# Patient Record
Sex: Female | Born: 1970 | Race: White | Hispanic: No | Marital: Single | Smoking: Never smoker
Health system: Southern US, Community
[De-identification: ages and names within clinical notes are randomized; demographics above are authoritative.]

## PROBLEM LIST (undated history)

## (undated) DIAGNOSIS — F419 Anxiety disorder, unspecified: Secondary | ICD-10-CM

## (undated) DIAGNOSIS — N201 Calculus of ureter: Secondary | ICD-10-CM

## (undated) DIAGNOSIS — J45990 Exercise induced bronchospasm: Secondary | ICD-10-CM

## (undated) DIAGNOSIS — K219 Gastro-esophageal reflux disease without esophagitis: Secondary | ICD-10-CM

## (undated) DIAGNOSIS — Z973 Presence of spectacles and contact lenses: Secondary | ICD-10-CM

## (undated) DIAGNOSIS — Z9109 Other allergy status, other than to drugs and biological substances: Secondary | ICD-10-CM

## (undated) DIAGNOSIS — R0602 Shortness of breath: Secondary | ICD-10-CM

## (undated) DIAGNOSIS — R319 Hematuria, unspecified: Secondary | ICD-10-CM

## (undated) DIAGNOSIS — Z8639 Personal history of other endocrine, nutritional and metabolic disease: Secondary | ICD-10-CM

## (undated) DIAGNOSIS — J189 Pneumonia, unspecified organism: Secondary | ICD-10-CM

## (undated) DIAGNOSIS — R3915 Urgency of urination: Secondary | ICD-10-CM

## (undated) DIAGNOSIS — N200 Calculus of kidney: Secondary | ICD-10-CM

## (undated) DIAGNOSIS — R51 Headache: Secondary | ICD-10-CM

## (undated) HISTORY — DX: Anxiety disorder, unspecified: F41.9

---
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
## 1998-06-25 HISTORY — PX: CERVICAL BIOPSY  W/ LOOP ELECTRODE EXCISION: SUR135

## 1998-11-02 ENCOUNTER — Other Ambulatory Visit: Admission: RE | Admit: 1998-11-02 | Discharge: 1998-11-02 | Payer: Self-pay | Admitting: Obstetrics and Gynecology

## 2001-02-28 ENCOUNTER — Other Ambulatory Visit: Admission: RE | Admit: 2001-02-28 | Discharge: 2001-02-28 | Payer: Self-pay | Admitting: Obstetrics and Gynecology

## 2001-04-11 ENCOUNTER — Other Ambulatory Visit: Admission: RE | Admit: 2001-04-11 | Discharge: 2001-04-11 | Payer: Self-pay | Admitting: Obstetrics and Gynecology

## 2001-09-02 ENCOUNTER — Other Ambulatory Visit: Admission: RE | Admit: 2001-09-02 | Discharge: 2001-09-02 | Payer: Self-pay | Admitting: Obstetrics and Gynecology

## 2002-03-03 ENCOUNTER — Other Ambulatory Visit: Admission: RE | Admit: 2002-03-03 | Discharge: 2002-03-03 | Payer: Self-pay | Admitting: Obstetrics and Gynecology

## 2002-12-01 ENCOUNTER — Other Ambulatory Visit: Admission: RE | Admit: 2002-12-01 | Discharge: 2002-12-01 | Payer: Self-pay | Admitting: Obstetrics and Gynecology

## 2003-03-26 ENCOUNTER — Other Ambulatory Visit: Admission: RE | Admit: 2003-03-26 | Discharge: 2003-03-26 | Payer: Self-pay | Admitting: Obstetrics and Gynecology

## 2003-09-07 ENCOUNTER — Ambulatory Visit (HOSPITAL_COMMUNITY): Admission: RE | Admit: 2003-09-07 | Discharge: 2003-09-07 | Payer: Self-pay | Admitting: Plastic Surgery

## 2003-09-14 HISTORY — PX: BREAST REDUCTION SURGERY: SHX8

## 2003-11-03 ENCOUNTER — Other Ambulatory Visit: Admission: RE | Admit: 2003-11-03 | Discharge: 2003-11-03 | Payer: Self-pay | Admitting: Obstetrics and Gynecology

## 2004-01-08 ENCOUNTER — Emergency Department (HOSPITAL_COMMUNITY): Admission: EM | Admit: 2004-01-08 | Discharge: 2004-01-08 | Payer: Self-pay | Admitting: Emergency Medicine

## 2004-06-16 ENCOUNTER — Other Ambulatory Visit: Admission: RE | Admit: 2004-06-16 | Discharge: 2004-06-16 | Payer: Self-pay | Admitting: Obstetrics and Gynecology

## 2005-03-01 ENCOUNTER — Other Ambulatory Visit: Admission: RE | Admit: 2005-03-01 | Discharge: 2005-03-01 | Payer: Self-pay | Admitting: Obstetrics and Gynecology

## 2005-09-11 ENCOUNTER — Other Ambulatory Visit: Admission: RE | Admit: 2005-09-11 | Discharge: 2005-09-11 | Payer: Self-pay | Admitting: Obstetrics & Gynecology

## 2006-10-01 ENCOUNTER — Other Ambulatory Visit: Admission: RE | Admit: 2006-10-01 | Discharge: 2006-10-01 | Payer: Self-pay | Admitting: Obstetrics & Gynecology

## 2008-06-01 ENCOUNTER — Other Ambulatory Visit: Admission: RE | Admit: 2008-06-01 | Discharge: 2008-06-01 | Payer: Self-pay | Admitting: Obstetrics and Gynecology

## 2008-09-16 ENCOUNTER — Emergency Department (HOSPITAL_COMMUNITY): Admission: EM | Admit: 2008-09-16 | Discharge: 2008-09-16 | Payer: Self-pay | Admitting: Emergency Medicine

## 2010-10-05 LAB — CBC
HCT: 41.4 % (ref 36.0–46.0)
Hemoglobin: 14.5 g/dL (ref 12.0–15.0)
MCV: 92.1 fL (ref 78.0–100.0)
RBC: 4.49 MIL/uL (ref 3.87–5.11)
WBC: 11.9 10*3/uL — ABNORMAL HIGH (ref 4.0–10.5)

## 2010-10-05 LAB — DIFFERENTIAL
Basophils Relative: 0 % (ref 0–1)
Lymphocytes Relative: 13 % (ref 12–46)
Lymphs Abs: 1.6 10*3/uL (ref 0.7–4.0)

## 2010-10-05 LAB — URINALYSIS, ROUTINE W REFLEX MICROSCOPIC
Bilirubin Urine: NEGATIVE
Ketones, ur: NEGATIVE mg/dL
Protein, ur: NEGATIVE mg/dL
Urobilinogen, UA: 0.2 mg/dL (ref 0.0–1.0)

## 2010-10-05 LAB — BASIC METABOLIC PANEL
Calcium: 9.5 mg/dL (ref 8.4–10.5)
GFR calc Af Amer: >60 mL/min (ref >60)
GFR calc non Af Amer: >60 mL/min (ref >60)
Sodium: 136 mEq/L (ref 135–145)

## 2010-10-05 LAB — PREGNANCY, URINE: Preg Test, Ur: NEGATIVE

## 2013-01-05 ENCOUNTER — Ambulatory Visit (INDEPENDENT_AMBULATORY_CARE_PROVIDER_SITE_OTHER): Payer: BC Managed Care – PPO | Admitting: Gynecology

## 2013-01-05 ENCOUNTER — Encounter: Payer: Self-pay | Admitting: Gynecology

## 2013-01-05 VITALS — BP 130/76 | HR 62 | Resp 18 | Ht 64.5 in | Wt 193.0 lb

## 2013-01-05 DIAGNOSIS — G43109 Migraine with aura, not intractable, without status migrainosus: Secondary | ICD-10-CM | POA: Insufficient documentation

## 2013-01-05 DIAGNOSIS — Z9889 Other specified postprocedural states: Secondary | ICD-10-CM

## 2013-01-05 DIAGNOSIS — Z87898 Personal history of other specified conditions: Secondary | ICD-10-CM

## 2013-01-05 DIAGNOSIS — Z01419 Encounter for gynecological examination (general) (routine) without abnormal findings: Secondary | ICD-10-CM

## 2013-01-05 DIAGNOSIS — Z8742 Personal history of other diseases of the female genital tract: Secondary | ICD-10-CM

## 2013-01-05 DIAGNOSIS — Z0000 Encounter for general adult medical examination without abnormal findings: Secondary | ICD-10-CM

## 2013-01-05 LAB — POCT URINALYSIS DIPSTICK
Leukocytes, UA: NEGATIVE
pH, UA: 5

## 2013-01-05 LAB — HEMOGLOBIN, FINGERSTICK: Hemoglobin, fingerstick: 14.5 g/dL (ref 12.0–16.0)

## 2013-01-05 NOTE — Assessment & Plan Note (Signed)
Annual pap

## 2013-01-05 NOTE — Patient Instructions (Signed)

## 2013-01-05 NOTE — Progress Notes (Signed)
42 y.o. Single Caucasian female   [id number] here for annual exam. Pt is  Not currently sexually active.  Happy with condom.  Menses are regular.  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  Denies unexplained bleeding, no pelvic pain.  Pt reports migraines 1-2x/yr with aura  Patient's last menstrual period was 12/12/2012.          Sexually active: not currently The current method of family planning is condoms.    Exercising: yes  jogging, walking, golfing 3x/wk Last pap: 2010/2011 Alcohol: Less than 1  Tobacco: no BSE: yes  Hgb: 14.5 ; Urine: Normal    No health maintenance topics applied.  No family history on file.  There are no active problems to display for this patient.   Past Medical History  Diagnosis Date  . Anxiety     Past Surgical History  Procedure Laterality Date  . Cervical biopsy  w/ loop electrode excision  2000    Normal  . Breast reduction  2006  . Tonsillectomy  1983    Allergies: Codeine  Current Outpatient Prescriptions  Medication Sig Dispense Refill  . Aspirin-Acetaminophen-Caffeine (MIGRAINE RELIEF PO) Take by mouth.      . calcium carbonate (TUMS - DOSED IN MG ELEMENTAL CALCIUM) 500 MG chewable tablet Chew 1 tablet by mouth daily.      Marland Kitchen CRANBERRY EXTRACT PO Take by mouth.      . MELATONIN PO Take by mouth.      . Multiple Vitamins-Minerals (MULTIVITAMIN PO) Take by mouth.      . Omega-3 Fatty Acids (FISH OIL PO) Take by mouth.       No current facility-administered medications for this visit.    ROS: Pertinent items are noted in HPI.  Exam:    BP 130/76  Pulse 62  Resp 18  Ht 5' 4.5" (1.638 m)  Wt 193 lb (87.544 kg)  BMI 32.63 kg/m2  LMP 12/12/2012 Weight change: @WEIGHTCHANGE @ Last 3 height recordings:  Ht Readings from Last 3 Encounters:  01/05/13 5' 4.5" (1.638 m)   General appearance: alert, cooperative and appears stated  age Head: Normocephalic, without obvious abnormality, atraumatic Neck: no adenopathy, no carotid bruit, no JVD, supple, symmetrical, trachea midline and thyroid not enlarged, symmetric, no tenderness/mass/nodules Lungs: clear to auscultation bilaterally Breasts: normal appearance, no masses or tenderness, s/p reduction Heart: regular rate and rhythm, S1, S2 normal, no murmur, click, rub or gallop Abdomen: soft, non-tender; bowel sounds normal; no masses,  no organomegaly Extremities: extremities normal, atraumatic, no cyanosis or edema Skin: Skin color, texture, turgor normal. No rashes or lesions Lymph nodes: Cervical, supraclavicular, and axillary nodes normal. no inguinal nodes palpated Neurologic: Grossly normal   Pelvic: External genitalia:  no lesions              Urethra: normal appearing urethra with no masses, tenderness or lesions              Bartholins and Skenes: normal                 Vagina: normal appearing vagina with normal color and discharge, no lesions              Cervix: normal appearance and LEEP scar              Pap taken: yes        Bimanual  Exam:  Uterus:  uterus is normal size, shape, consistency and nontender                                      Adnexa:    normal adnexa in size, nontender and no masses                                      Rectovaginal: Confirms                                      Anus:  normal sphincter tone, no lesions  A: well woman Contraceptive management     P: mammogram starting now pap smear qy for 20 Migraines with aura- contraindication to combination ocp return annually or prn   An After Visit Summary was printed and given to the patient.

## 2013-01-06 ENCOUNTER — Encounter: Payer: Self-pay | Admitting: Nurse Practitioner

## 2013-01-13 ENCOUNTER — Telehealth: Payer: Self-pay | Admitting: *Deleted

## 2013-01-13 NOTE — Telephone Encounter (Signed)
Patient notified.  See labs  

## 2013-01-13 NOTE — Telephone Encounter (Signed)
Message copied by Lorraine Lax on Tue Jan 13, 2013 10:28 AM ------      Message from: Douglass Rivers      Created: Fri Jan 09, 2013 12:51 PM       Inform neg, recall 8, h/o LEEP CIN II ------

## 2013-01-13 NOTE — Telephone Encounter (Signed)
Left Message To Call Back  

## 2013-10-27 ENCOUNTER — Encounter (HOSPITAL_COMMUNITY): Payer: Self-pay | Admitting: Emergency Medicine

## 2013-10-27 ENCOUNTER — Emergency Department (HOSPITAL_COMMUNITY)
Admission: EM | Admit: 2013-10-27 | Discharge: 2013-10-27 | Disposition: A | Discharge status: Home / Self Care | Payer: BC Managed Care – PPO | Attending: Emergency Medicine | Admitting: Emergency Medicine

## 2013-10-27 DIAGNOSIS — R109 Unspecified abdominal pain: Secondary | ICD-10-CM | POA: Insufficient documentation

## 2013-10-27 DIAGNOSIS — Z3202 Encounter for pregnancy test, result negative: Secondary | ICD-10-CM | POA: Insufficient documentation

## 2013-10-27 DIAGNOSIS — Z79899 Other long term (current) drug therapy: Secondary | ICD-10-CM | POA: Insufficient documentation

## 2013-10-27 DIAGNOSIS — N3090 Cystitis, unspecified without hematuria: Secondary | ICD-10-CM | POA: Insufficient documentation

## 2013-10-27 DIAGNOSIS — IMO0001 Reserved for inherently not codable concepts without codable children: Secondary | ICD-10-CM

## 2013-10-27 DIAGNOSIS — R55 Syncope and collapse: Secondary | ICD-10-CM | POA: Insufficient documentation

## 2013-10-27 DIAGNOSIS — R110 Nausea: Secondary | ICD-10-CM | POA: Insufficient documentation

## 2013-10-27 DIAGNOSIS — Z791 Long term (current) use of non-steroidal anti-inflammatories (NSAID): Secondary | ICD-10-CM | POA: Insufficient documentation

## 2013-10-27 DIAGNOSIS — R259 Unspecified abnormal involuntary movements: Secondary | ICD-10-CM | POA: Insufficient documentation

## 2013-10-27 DIAGNOSIS — N3091 Cystitis, unspecified with hematuria: Secondary | ICD-10-CM

## 2013-10-27 DIAGNOSIS — Z8659 Personal history of other mental and behavioral disorders: Secondary | ICD-10-CM | POA: Insufficient documentation

## 2013-10-27 DIAGNOSIS — Z87440 Personal history of urinary (tract) infections: Secondary | ICD-10-CM | POA: Insufficient documentation

## 2013-10-27 DIAGNOSIS — R569 Unspecified convulsions: Secondary | ICD-10-CM | POA: Insufficient documentation

## 2013-10-27 LAB — CBC WITH DIFFERENTIAL/PLATELET
BASOS PCT: 0 % (ref 0–1)
Basophils Absolute: 0 10*3/uL (ref 0.0–0.1)
EOS ABS: 0.6 10*3/uL (ref 0.0–0.7)
EOS PCT: 6 % — AB (ref 0–5)
HCT: 42.2 % (ref 36.0–46.0)
Hemoglobin: 14.9 g/dL (ref 12.0–15.0)
Lymphocytes Relative: 17 % (ref 12–46)
Lymphs Abs: 1.6 10*3/uL (ref 0.7–4.0)
MCH: 32 pg (ref 26.0–34.0)
MCHC: 35.3 g/dL (ref 30.0–36.0)
MCV: 90.8 fL (ref 78.0–100.0)
Monocytes Absolute: 0.4 10*3/uL (ref 0.1–1.0)
Monocytes Relative: 5 % (ref 3–12)
NEUTROS ABS: 6.7 10*3/uL (ref 1.7–7.7)
NEUTROS PCT: 72 % (ref 43–77)
PLATELETS: 279 10*3/uL (ref 150–400)
RBC: 4.65 MIL/uL (ref 3.87–5.11)
RDW: 12.9 % (ref 11.5–15.5)
WBC: 9.3 10*3/uL (ref 4.0–10.5)

## 2013-10-27 LAB — WET PREP, GENITAL
Clue Cells Wet Prep HPF POC: NONE SEEN
Trich, Wet Prep: NONE SEEN
YEAST WET PREP: NONE SEEN

## 2013-10-27 LAB — BASIC METABOLIC PANEL
BUN: 18 mg/dL (ref 6–23)
CO2: 27 meq/L (ref 19–32)
Calcium: 9.6 mg/dL (ref 8.4–10.5)
Chloride: 97 mEq/L (ref 96–112)
Creatinine, Ser: 0.7 mg/dL (ref 0.50–1.10)
GLUCOSE: 102 mg/dL — AB (ref 70–99)
POTASSIUM: 4.2 meq/L (ref 3.7–5.3)
SODIUM: 136 meq/L — AB (ref 137–147)

## 2013-10-27 LAB — PREGNANCY, URINE: PREG TEST UR: NEGATIVE

## 2013-10-27 LAB — URINE MICROSCOPIC-ADD ON

## 2013-10-27 LAB — URINALYSIS, ROUTINE W REFLEX MICROSCOPIC
Glucose, UA: NEGATIVE mg/dL
Ketones, ur: NEGATIVE mg/dL
LEUKOCYTES UA: NEGATIVE
Nitrite: POSITIVE — AB
PH: 5 (ref 5.0–8.0)
Protein, ur: 100 mg/dL — AB
UROBILINOGEN UA: 1 mg/dL (ref 0.0–1.0)

## 2013-10-27 MED ORDER — PHENAZOPYRIDINE HCL 200 MG PO TABS: 200.0000 mg | ORAL_TABLET | Freq: Three times a day (TID) | ORAL | Status: DC | PRN

## 2013-10-27 MED ORDER — SULFAMETHOXAZOLE-TRIMETHOPRIM 800-160 MG PO TABS
1.0000 | ORAL_TABLET | Freq: Two times a day (BID) | ORAL | Status: DC
Start: 2013-10-27 — End: 2013-12-17

## 2013-10-27 NOTE — ED Notes (Signed)
Pt was at work today and [employer] menstral like abdominal pain today per pt she took a Midol. Pt noted blood in her urine at work and felt faint and laid down in the floor and passed out. Coworkers called EMS. Pt states that she faints at the site of blood and it is not unusual for her.

## 2013-10-27 NOTE — Discharge Instructions (Signed)
Drink plenty of fluids. Take the antibiotic until gone. Take the pyridium for your bladder spasm pain. Recheck if you get a fever, vomiting, pain in your back or you feel worse.

## 2013-10-27 NOTE — ED Provider Notes (Signed)
CSN: [id number]     Arrival date & time 10/27/13  1019 History  This chart was scribed for Ward GivensIva L Jasmeet Gehl, MD by Joaquin MusicKristina Sanchez-Matthews, ED Scribe. This patient was seen in room APA07/APA07 and the patient's care was started at 11:31 AM.   Chief Complaint  Patient presents with  . Loss of Consciousness   No language interpreter was used.   HPI Comments: Kelli Harmon is a 43 y.o. female brought in by ambulance who presents to the Emergency Department complaining of sudden LOC that occurred this morning. Pt states she used the restroom this morning, noticed blood and dark colored urine. Reports having mild aching abd pain with associated nausea but states she took OTC Acetaminophen. States she was having mild crampy lower abdominal pain that began yesterday in the left. She states she generally has syncopal episodes with the sight of blood and other events for years and reports having seizures when she has syncope as well in the most recent years. About 9:30 this morning she started getting return of the lower crit the abdominal discomfort and when she went to the bathroom she saw blood in her urine again. She states she she started to leave her work to walk out to the parking lot to go get evaluated for the blood in her urine, however she started feeling clammy and shaky and felt like she was going to pass out. She went into the bathroom and the feeling persisted so she laid down on the floor. She had nausea without vomiting. She had a syncopal episode while on the floor in the restroom with some seizure activity and states she had a colleague in the restroom with her at the time. Colleague ran out to get help and unsure how long her seizure lasted. States she woke up feeling normal and reports being aware where she was. She did not have a post ictal state afterwards and had no incontinence. She has no injury from the seizure. Pt states she is aware of her body and states she "knows when she is going to  have a syncopal episode". Pt states her father has a hx of seizures post syncopal episodes also. Reports having a waxing and waning ache while urinating. LNMP was 2 weeks ago. Reports one prior UTI 2 years ago. Denies diarrhea, fever, lower back pain, injuries due to seizure at this time, spotting, possibility of pregnancy due to not having sex, taking medications regularly, smoking and drinking.  PCP was Dr. Georgina PillionMassey but is now [facility] [place].   Past Medical History  Diagnosis Date  . Anxiety    Past Surgical History  Procedure Laterality Date  . Cervical biopsy  w/ loop electrode excision  2000    Normal  . Breast reduction  09/14/03  . Tonsillectomy  1983   No family history on file. History  Substance Use Topics  . Smoking status: Never Smoker   . Smokeless tobacco: Not on file  . Alcohol Use: 2.0 oz/week    4 drink(s) per week   employed  OB History   Grav Para Term Preterm Abortions TAB SAB Ect Mult Living   0 0 0 0 0 0 0 0 0 0      Review of Systems  Constitutional: Negative for fever.  Cardiovascular: Positive for syncope.  Gastrointestinal: Positive for nausea and abdominal pain. Negative for vomiting and diarrhea.  Musculoskeletal: Negative for back pain and myalgias.  Neurological: Positive for syncope.  All other systems  reviewed and are negative.  Allergies  Codeine  Home Medications   Prior to Admission medications   Medication Sig Start Date End Date Taking? Authorizing Provider  GINSENG PO Take 1 capsule by mouth daily.   Yes Historical Provider, MD  Ibuprofen (MIDOL) 200 MG CAPS Take 1 capsule by mouth daily as needed (pain).   Yes Historical Provider, MD  MELATONIN PO Take 1 tablet by mouth at bedtime as needed (sleep).    Yes Historical Provider, MD  Multiple Vitamins-Minerals (MULTIVITAMIN PO) Take 1 tablet by mouth daily.    Yes Historical Provider, MD  naproxen sodium (ALEVE) 220 MG tablet Take 220 mg by mouth daily as needed  (headache).   Yes Historical Provider, MD   BP 153/98  Pulse 76  Temp(Src) 97.8 F (36.6 C) (Oral)  Resp 18  Ht 5\' 4"  (1.626 m)  Wt 180 lb (81.647 kg)  BMI 30.88 kg/m2  SpO2 100%  LMP 10/20/2013  Vital signs normal   Orthostatic vital signs showed no hypotension or tachycardia   Physical Exam  Nursing note and vitals reviewed. Constitutional: She is oriented to person, place, and time. She appears well-developed and well-nourished.  Non-toxic appearance. She does not appear ill. No distress.  HENT:  Head: Normocephalic and atraumatic.  Right Ear: External ear normal.  Left Ear: External ear normal.  Nose: Nose normal. No mucosal edema or rhinorrhea.  Mouth/Throat: Oropharynx is clear and moist and mucous membranes are normal. No dental abscesses or uvula swelling.  Eyes: Conjunctivae and EOM are normal. Pupils are equal, round, and reactive to light.  Neck: Normal range of motion and full passive range of motion without pain. Neck supple.  Cardiovascular: Normal rate, regular rhythm and normal heart sounds.  Exam reveals no gallop and no friction rub.   No murmur heard. Pulmonary/Chest: Effort normal and breath sounds normal. No respiratory distress. She has no wheezes. She has no rhonchi. She has no rales. She exhibits no tenderness and no crepitus.  Abdominal: Soft. Normal appearance and bowel sounds are normal. She exhibits no distension. There is no tenderness. There is no rebound and no guarding.  Genitourinary:  Normal external genitalia. No blood in the vault. There is some thick yellow white discharge. She has minor discomfort to palpation of the uterus and the left adnexa. There are no masses. Right adnexa is nontender.  Musculoskeletal: Normal range of motion. She exhibits no edema and no tenderness.  Moves all extremities well.   Neurological: She is alert and oriented to person, place, and time. She has normal strength. No cranial nerve deficit.  Skin: Skin is warm,  dry and intact. No rash noted. No erythema. No pallor.  Psychiatric: She has a normal mood and affect. Her speech is normal and behavior is normal. Her mood appears not anxious.    ED Course  Procedures   Medications - No data to display  DIAGNOSTIC STUDIES: Oxygen Saturation is 99% on RA, normal by my interpretation.    COORDINATION OF CARE: 11:38 AM-Discussed treatment plan which includes UA and labs. Pt agreed to plan.   Labs Review Results for orders placed during the hospital encounter of 10/27/13  WET PREP, GENITAL      Result Value Ref Range   Yeast Wet Prep HPF POC NONE SEEN  NONE SEEN   Trich, Wet Prep NONE SEEN  NONE SEEN   Clue Cells Wet Prep HPF POC NONE SEEN  NONE SEEN   WBC, Wet Prep HPF POC MANY (*)  NONE SEEN  BASIC METABOLIC PANEL      Result Value Ref Range   Sodium 136 (*) 137 - 147 mEq/L   Potassium 4.2  3.7 - 5.3 mEq/L   Chloride 97  96 - 112 mEq/L   CO2 27  19 - 32 mEq/L   Glucose, Bld 102 (*) 70 - 99 mg/dL   BUN 18  6 - 23 mg/dL   Creatinine, Ser 9.60  0.50 - 1.10 mg/dL   Calcium 9.6  8.4 - 45.4 mg/dL   GFR calc non Af Amer >90  >90 mL/min   GFR calc Af Amer >90  >90 mL/min  CBC WITH DIFFERENTIAL      Result Value Ref Range   WBC 9.3  4.0 - 10.5 K/uL   RBC 4.65  3.87 - 5.11 MIL/uL   Hemoglobin 14.9  12.0 - 15.0 g/dL   HCT 09.8  11.9 - 14.7 %   MCV 90.8  78.0 - 100.0 fL   MCH 32.0  26.0 - 34.0 pg   MCHC 35.3  30.0 - 36.0 g/dL   RDW 82.9  56.2 - 13.0 %   Platelets 279  150 - 400 K/uL   Neutrophils Relative % 72  43 - 77 %   Neutro Abs 6.7  1.7 - 7.7 K/uL   Lymphocytes Relative 17  12 - 46 %   Lymphs Abs 1.6  0.7 - 4.0 K/uL   Monocytes Relative 5  3 - 12 %   Monocytes Absolute 0.4  0.1 - 1.0 K/uL   Eosinophils Relative 6 (*) 0 - 5 %   Eosinophils Absolute 0.6  0.0 - 0.7 K/uL   Basophils Relative 0  0 - 1 %   Basophils Absolute 0.0  0.0 - 0.1 K/uL  URINALYSIS, ROUTINE W REFLEX MICROSCOPIC      Result Value Ref Range   Color, Urine BROWN (*)  YELLOW   APPearance HAZY (*) CLEAR   Specific Gravity, Urine >1.030 (*) 1.005 - 1.030   pH 5.0  5.0 - 8.0   Glucose, UA NEGATIVE  NEGATIVE mg/dL   Hgb urine dipstick LARGE (*) NEGATIVE   Bilirubin Urine SMALL (*) NEGATIVE   Ketones, ur NEGATIVE  NEGATIVE mg/dL   Protein, ur 865 (*) NEGATIVE mg/dL   Urobilinogen, UA 1.0  0.0 - 1.0 mg/dL   Nitrite POSITIVE (*) NEGATIVE   Leukocytes, UA NEGATIVE  NEGATIVE  PREGNANCY, URINE      Result Value Ref Range   Preg Test, Ur NEGATIVE  NEGATIVE  URINE MICROSCOPIC-ADD ON      Result Value Ref Range   RBC / HPF TOO NUMEROUS TO COUNT  <3 RBC/hpf   Bacteria, UA MANY (*) RARE   Laboratory interpretation all normal except probable UTI      Imaging Review No results found.   EKG Interpretation None        Date: 10/27/2013  Rate: 85  Rhythm: normal sinus rhythm  QRS Axis: right  Intervals: normal  ST/T Wave abnormalities: normal  Conduction Disutrbances:none  Narrative Interpretation: low voltage precordial leads  Old EKG Reviewed: none available    MDM   Final diagnoses:  Vasovagal episode  Episode of jerking  Hemorrhagic cystitis    Discharge Medication List as of 10/27/2013  2:50 PM    START taking these medications   Details  phenazopyridine (PYRIDIUM) 200 MG tablet Take 1 tablet (200 mg total) by mouth 3 (three) times daily as needed (pain on urination)., Starting 10/27/2013,  Until Discontinued, Print    sulfamethoxazole-trimethoprim (SEPTRA DS) 800-160 MG per tablet Take 1 tablet by mouth every 12 (twelve) hours., Starting 10/27/2013, Until Discontinued, Print        Plan discharge   Devoria Albe, MD, FACEP   I personally performed the services described in this documentation, which was scribed in my presence. The recorded information has been reviewed and considered.  Devoria Albe, MD, Armando Gang    Ward Givens, MD 10/27/13 [phone]

## 2013-10-27 NOTE — ED Notes (Signed)
PT d/c to home with mother with NAD

## 2013-10-28 LAB — URINE CULTURE
COLONY COUNT: NO GROWTH
Culture: NO GROWTH

## 2013-10-28 LAB — GC/CHLAMYDIA PROBE AMP
CT Probe RNA: NEGATIVE
GC Probe RNA: NEGATIVE

## 2013-12-16 ENCOUNTER — Ambulatory Visit
Admission: RE | Admit: 2013-12-16 | Discharge: 2013-12-16 | Disposition: A | Discharge status: Home / Self Care | Payer: BC Managed Care – PPO | Source: Ambulatory Visit | Attending: Nephrology | Admitting: Nephrology

## 2013-12-16 ENCOUNTER — Other Ambulatory Visit: Payer: Self-pay | Admitting: Nephrology

## 2013-12-16 DIAGNOSIS — R319 Hematuria, unspecified: Secondary | ICD-10-CM

## 2013-12-16 DIAGNOSIS — R109 Unspecified abdominal pain: Secondary | ICD-10-CM

## 2013-12-17 ENCOUNTER — Other Ambulatory Visit: Payer: Self-pay | Admitting: Family Medicine

## 2013-12-17 ENCOUNTER — Other Ambulatory Visit: Payer: Self-pay | Admitting: Urology

## 2013-12-17 ENCOUNTER — Encounter (HOSPITAL_BASED_OUTPATIENT_CLINIC_OR_DEPARTMENT_OTHER): Payer: Self-pay | Admitting: *Deleted

## 2013-12-17 DIAGNOSIS — R109 Unspecified abdominal pain: Secondary | ICD-10-CM

## 2013-12-17 DIAGNOSIS — R319 Hematuria, unspecified: Secondary | ICD-10-CM

## 2013-12-17 NOTE — Progress Notes (Signed)
NPO AFTER MN. ARRIVE AT 0830. NEEDS HG AND URINE PREG. WILL TAKE PRILOSEC AM DOS W/ SIPS OF WATER AND IF NEEDED TAKE PAIN RX.

## 2013-12-18 ENCOUNTER — Encounter (HOSPITAL_BASED_OUTPATIENT_CLINIC_OR_DEPARTMENT_OTHER): Payer: Self-pay | Admitting: Anesthesiology

## 2013-12-18 ENCOUNTER — Encounter (HOSPITAL_BASED_OUTPATIENT_CLINIC_OR_DEPARTMENT_OTHER)
Admission: RE | Disposition: A | Discharge status: Home / Self Care | Payer: Self-pay | Source: Ambulatory Visit | Attending: Urology

## 2013-12-18 ENCOUNTER — Ambulatory Visit (HOSPITAL_BASED_OUTPATIENT_CLINIC_OR_DEPARTMENT_OTHER)
Admission: RE | Admit: 2013-12-18 | Discharge: 2013-12-18 | Disposition: A | Discharge status: Home / Self Care | Payer: BC Managed Care – PPO | Source: Ambulatory Visit | Attending: Urology | Admitting: Urology

## 2013-12-18 ENCOUNTER — Ambulatory Visit (HOSPITAL_BASED_OUTPATIENT_CLINIC_OR_DEPARTMENT_OTHER): Payer: BC Managed Care – PPO | Admitting: Anesthesiology

## 2013-12-18 ENCOUNTER — Encounter (HOSPITAL_BASED_OUTPATIENT_CLINIC_OR_DEPARTMENT_OTHER): Payer: BC Managed Care – PPO | Admitting: Anesthesiology

## 2013-12-18 DIAGNOSIS — Z885 Allergy status to narcotic agent status: Secondary | ICD-10-CM | POA: Insufficient documentation

## 2013-12-18 DIAGNOSIS — F411 Generalized anxiety disorder: Secondary | ICD-10-CM | POA: Insufficient documentation

## 2013-12-18 DIAGNOSIS — J45909 Unspecified asthma, uncomplicated: Secondary | ICD-10-CM | POA: Insufficient documentation

## 2013-12-18 DIAGNOSIS — E039 Hypothyroidism, unspecified: Secondary | ICD-10-CM | POA: Insufficient documentation

## 2013-12-18 DIAGNOSIS — Z79899 Other long term (current) drug therapy: Secondary | ICD-10-CM | POA: Insufficient documentation

## 2013-12-18 DIAGNOSIS — N201 Calculus of ureter: Secondary | ICD-10-CM | POA: Insufficient documentation

## 2013-12-18 DIAGNOSIS — K219 Gastro-esophageal reflux disease without esophagitis: Secondary | ICD-10-CM | POA: Insufficient documentation

## 2013-12-18 HISTORY — PX: CYSTOSCOPY WITH RETROGRADE PYELOGRAM, URETEROSCOPY AND STENT PLACEMENT: SHX5789

## 2013-12-18 HISTORY — DX: Calculus of ureter: N20.1

## 2013-12-18 HISTORY — DX: Personal history of other endocrine, nutritional and metabolic disease: Z86.39

## 2013-12-18 HISTORY — DX: Exercise induced bronchospasm: J45.990

## 2013-12-18 HISTORY — DX: Hematuria, unspecified: R31.9

## 2013-12-18 HISTORY — DX: Urgency of urination: R39.15

## 2013-12-18 HISTORY — PX: HOLMIUM LASER APPLICATION: SHX5852

## 2013-12-18 HISTORY — DX: Presence of spectacles and contact lenses: Z97.3

## 2013-12-18 HISTORY — DX: Calculus of kidney: N20.0

## 2013-12-18 HISTORY — DX: Gastro-esophageal reflux disease without esophagitis: K21.9

## 2013-12-18 LAB — POCT PREGNANCY, URINE: Preg Test, Ur: NEGATIVE

## 2013-12-18 SURGERY — CYSTOURETEROSCOPY, WITH RETROGRADE PYELOGRAM AND STENT INSERTION
Anesthesia: General | Site: Ureter | Laterality: Right

## 2013-12-18 MED ORDER — LACTATED RINGERS IV SOLN
INTRAVENOUS | Status: DC
  Filled 2013-12-18: qty 1000

## 2013-12-18 MED ORDER — FENTANYL CITRATE 0.05 MG/ML IJ SOLN
INTRAMUSCULAR | Status: DC | PRN
  Administered 2013-12-18: 50 ug via INTRAVENOUS
  Administered 2013-12-18 (×3): 25 ug via INTRAVENOUS

## 2013-12-18 MED ORDER — DEXAMETHASONE SODIUM PHOSPHATE 4 MG/ML IJ SOLN
INTRAMUSCULAR | Status: DC | PRN
  Administered 2013-12-18: 10 mg via INTRAVENOUS

## 2013-12-18 MED ORDER — GLYCOPYRROLATE 0.2 MG/ML IJ SOLN
INTRAMUSCULAR | Status: DC | PRN
  Administered 2013-12-18: 0.2 mg via INTRAVENOUS

## 2013-12-18 MED ORDER — SODIUM CHLORIDE 0.9 % IR SOLN
Status: DC | PRN
  Administered 2013-12-18: 4000 mL

## 2013-12-18 MED ORDER — MIDAZOLAM HCL 5 MG/5ML IJ SOLN
INTRAMUSCULAR | Status: DC | PRN
  Administered 2013-12-18: 2 mg via INTRAVENOUS

## 2013-12-18 MED ORDER — PROMETHAZINE HCL 25 MG/ML IJ SOLN
6.2500 mg | INTRAMUSCULAR | Status: DC | PRN
  Filled 2013-12-18: qty 1

## 2013-12-18 MED ORDER — ONDANSETRON HCL 4 MG/2ML IJ SOLN
4.0000 mg | Freq: Once | INTRAMUSCULAR | Status: AC
  Administered 2013-12-18: 4 mg via INTRAVENOUS
  Filled 2013-12-18: qty 2

## 2013-12-18 MED ORDER — IOHEXOL 350 MG/ML SOLN
INTRAVENOUS | Status: DC | PRN
  Administered 2013-12-18: 10 mL

## 2013-12-18 MED ORDER — ONDANSETRON HCL 4 MG/2ML IJ SOLN
INTRAMUSCULAR | Status: DC | PRN
  Administered 2013-12-18: 4 mg via INTRAVENOUS

## 2013-12-18 MED ORDER — FENTANYL CITRATE 0.05 MG/ML IJ SOLN
INTRAMUSCULAR | Status: AC
  Filled 2013-12-18: qty 4

## 2013-12-18 MED ORDER — PROPOFOL 10 MG/ML IV BOLUS
INTRAVENOUS | Status: DC | PRN
  Administered 2013-12-18: 180 mg via INTRAVENOUS

## 2013-12-18 MED ORDER — MIDAZOLAM HCL 2 MG/2ML IJ SOLN
INTRAMUSCULAR | Status: AC
  Filled 2013-12-18: qty 2

## 2013-12-18 MED ORDER — CEFAZOLIN SODIUM 1-5 GM-% IV SOLN
1.0000 g | INTRAVENOUS | Status: DC
Start: 2013-12-18 — End: 2013-12-18
  Filled 2013-12-18: qty 50

## 2013-12-18 MED ORDER — CEFAZOLIN SODIUM-DEXTROSE 2-3 GM-% IV SOLR
2.0000 g | INTRAVENOUS | Status: AC
Start: 2013-12-18 — End: 2013-12-18
  Administered 2013-12-18: 2 g via INTRAVENOUS
  Filled 2013-12-18: qty 50

## 2013-12-18 MED ORDER — KETOROLAC TROMETHAMINE 30 MG/ML IJ SOLN
INTRAMUSCULAR | Status: DC | PRN
  Administered 2013-12-18: 30 mg via INTRAVENOUS

## 2013-12-18 MED ORDER — LACTATED RINGERS IV SOLN
INTRAVENOUS | Status: DC
  Administered 2013-12-18 (×2): via INTRAVENOUS
  Filled 2013-12-18: qty 1000

## 2013-12-18 MED ORDER — SUCCINYLCHOLINE CHLORIDE 20 MG/ML IJ SOLN
INTRAMUSCULAR | Status: DC | PRN
  Administered 2013-12-18: 100 mg via INTRAVENOUS

## 2013-12-18 MED ORDER — ACETAMINOPHEN 10 MG/ML IV SOLN
INTRAVENOUS | Status: DC | PRN
  Administered 2013-12-18: 1000 mg via INTRAVENOUS

## 2013-12-18 MED ORDER — LIDOCAINE HCL (CARDIAC) 20 MG/ML IV SOLN
INTRAVENOUS | Status: DC | PRN
  Administered 2013-12-18: 60 mg via INTRAVENOUS

## 2013-12-18 MED ORDER — FENTANYL CITRATE 0.05 MG/ML IJ SOLN
25.0000 ug | INTRAMUSCULAR | Status: DC | PRN
  Filled 2013-12-18: qty 1

## 2013-12-18 SURGICAL SUPPLY — 39 items
ADAPTER CATH URET PLST 4-6FR (CATHETERS) IMPLANT
BAG DRAIN URO-CYSTO SKYTR STRL (DRAIN) ×3 IMPLANT
BASKET LASER NITINOL 1.9FR (BASKET) IMPLANT
BASKET STNLS GEMINI 4WIRE 3FR (BASKET) IMPLANT
BASKET ZERO TIP NITINOL 2.4FR (BASKET) ×6 IMPLANT
CANISTER SUCT LVC 12 LTR MEDI- (MISCELLANEOUS) ×3 IMPLANT
CATH INTERMIT  6FR 70CM (CATHETERS) IMPLANT
CATH URET 5FR 28IN CONE TIP (BALLOONS) ×2
CATH URET 5FR 28IN OPEN ENDED (CATHETERS) IMPLANT
CATH URET 5FR 70CM CONE TIP (BALLOONS) ×1 IMPLANT
CLOTH BEACON ORANGE TIMEOUT ST (SAFETY) ×3 IMPLANT
CONTOUR, ×3 IMPLANT
DRAPE CAMERA CLOSED 9X96 (DRAPES) ×3 IMPLANT
FIBER LASER FLEXIVA 1000 (UROLOGICAL SUPPLIES) IMPLANT
FIBER LASER FLEXIVA 200 (UROLOGICAL SUPPLIES) IMPLANT
FIBER LASER FLEXIVA 365 (UROLOGICAL SUPPLIES) IMPLANT
FIBER LASER FLEXIVA 550 (UROLOGICAL SUPPLIES) IMPLANT
FIBER LASER TRAC TIP (UROLOGICAL SUPPLIES) ×3 IMPLANT
GLOVE BIO SURGEON STRL SZ7 (GLOVE) ×3 IMPLANT
GLOVE BIOGEL M STER SZ 6 (GLOVE) ×3 IMPLANT
GLOVE BIOGEL PI IND STRL 6.5 (GLOVE) ×2 IMPLANT
GLOVE BIOGEL PI INDICATOR 6.5 (GLOVE) ×4
GOWN STRL REUS W/TWL LRG LVL3 (GOWN DISPOSABLE) ×6 IMPLANT
GUIDEWIRE 0.038 PTFE COATED (WIRE) ×3 IMPLANT
GUIDEWIRE ANG ZIPWIRE 038X150 (WIRE) IMPLANT
GUIDEWIRE STR DUAL SENSOR (WIRE) ×3 IMPLANT
IV NS 1000ML (IV SOLUTION) ×4
IV NS 1000ML BAXH (IV SOLUTION) ×2 IMPLANT
IV NS IRRIG 3000ML ARTHROMATIC (IV SOLUTION) ×3 IMPLANT
KIT BALLIN UROMAX 15FX10 (LABEL) IMPLANT
KIT BALLN UROMAX 15FX4 (MISCELLANEOUS) IMPLANT
KIT BALLN UROMAX 26 75X4 (MISCELLANEOUS)
NS IRRIG 500ML POUR BTL (IV SOLUTION) ×3 IMPLANT
PACK CYSTOSCOPY (CUSTOM PROCEDURE TRAY) ×3 IMPLANT
SET HIGH PRES BAL DIL (LABEL)
SHEATH ACCESS URETERAL 38CM (SHEATH) ×3 IMPLANT
SHEATH URET ACCESS 12FR/35CM (UROLOGICAL SUPPLIES) IMPLANT
SHEATH URET ACCESS 12FR/55CM (UROLOGICAL SUPPLIES) IMPLANT
STENT URET 6FRX26 CONTOUR (STENTS) ×3 IMPLANT

## 2013-12-18 NOTE — Op Note (Signed)
Kelli Harmon is a 43 y.o.   12/18/2013  General  Preop diagnosis: Right proximal ureteral calculus  Postop diagnosis: Same  Procedure done: Cystoscopy, right retrograde pyelogram, ureteroscopy with holmium laser of proximal ureteral calculus, stone extraction, insertion of double-J stent.   Surgeon: Wendie SimmerMarc H. Nesi  Anesthesia: General  Indication: Patient is a 43 years old female who has been complaining of mild to moderate right flank pain on and off for about a month now. However she continued to have pain on and off. 2 days ago the pain became severe. Her PCP ordered a CT scan that revealed a 7 x 4 mm right proximal ureteral calculus and a 9 x 8 mm stone in the lower pole of the left kidney. She is now scheduled for cystoscopy, right retrograde pyelogram, ureteroscopy, stone manipulation.  Procedure: The patient was identified by her wrist band and proper timeout was taken.  Under general anesthesia patient was prepped and draped and placed in the dorsolithotomy position. A panendoscope was inserted in the bladder. The bladder mucosa is normal. There is no stone or tumor in the bladder. The ureteral orifices are in normal position and shape.  Right retrograde pyelogram:  A cone-tip catheter was passed through the cystoscope and the right ureteral orifice. Contrast was then injected through the cone-tip catheter. The distal and mid ureter appear normal. There is a filling defect in the proximal ureter right below the UPJ. The renal pelvis and calyces are moderately dilated. The cone-tip catheter was then removed.  A sensor wire was passed through the cystoscope and the right ureter. The cystoscope was removed.  A digital ureteroscope access sheath was then passed over the sensor wire up to the level of the stone. The sensor wire was removed. Then a digital ureteroscope was passed through the ureteroscope access sheath. The stone was identified in the proximal ureter. With a 200  microfiber holmium laser the stone was fragmented in multiple small fragments. The stone fragments were removed with the Nitinol basket. One of the fragments was too large to pass through the ureteroscope access sheath. It was caught within the wires of a Nitinol basket. I then cut the Nitinol basket and remove the sheath of the basket. The holmium laser was then passed through the ureteroscope and the stone was fragmented in smaller fragments. The Nitinol basket was then removed. The smaller stone fragments were removed with a Nitinol basket. There was no evidence of remaining stone fragments in the ureter. The ureteroscope was advanced up to the renal pelvis and there was no evidence of fragments in the renal pelvis.  Contrast was then injected through the ureteroscope. The ureter appears normal. There is no evidence of extravasation of contrast. The ureteroscope was then removed. A sensor wire was passed through the ureteroscope access sheath in the renal pelvis. The ureteroscope access sheath was then removed. The sensor wire was then backloaded into the cystoscope and a #[id number] French-[id number] double-J stent was passed over the sensor wire. The sensor wire was removed. The proximal curl of the double-J stent is in the renal pelvis; the distal curl is in the bladder. The bladder was then emptied and the cystoscope removed.  The patient tolerated the procedure well and left the OR in satisfactory condition to postanesthesia care unit.

## 2013-12-18 NOTE — Anesthesia Preprocedure Evaluation (Signed)
Anesthesia Evaluation  Patient identified by MRN, date of birth, ID band Patient awake    Reviewed: Allergy & Precautions, H&P , NPO status , Patient's Chart, lab work & pertinent test results  Airway Mallampati: II TM Distance: >3 FB Neck ROM: Full    Dental  (+) Teeth Intact, Dental Advisory Given   Pulmonary neg pulmonary ROS, asthma ,  breath sounds clear to auscultation  Pulmonary exam normal       Cardiovascular negative cardio ROS  Rhythm:Regular Rate:Normal     Neuro/Psych Anxiety negative neurological ROS  negative psych ROS   GI/Hepatic negative GI ROS, Neg liver ROS, GERD-  ,  Endo/Other  Hypothyroidism   Renal/GU Renal diseasenegative Renal ROS  negative genitourinary   Musculoskeletal negative musculoskeletal ROS (+)   Abdominal   Peds  Hematology negative hematology ROS (+)   Anesthesia Other Findings   Reproductive/Obstetrics                           Anesthesia Physical Anesthesia Plan  ASA: II  Anesthesia Plan: General   Post-op Pain Management:    Induction: Intravenous  Airway Management Planned: LMA  Additional Equipment:   Intra-op Plan:   Post-operative Plan: Extubation in OR  Informed Consent: I have reviewed the patients History and Physical, chart, labs and discussed the procedure including the risks, benefits and alternatives for the proposed anesthesia with the patient or authorized representative who has indicated his/her understanding and acceptance.   Dental advisory given  Plan Discussed with: CRNA  Anesthesia Plan Comments:         Anesthesia Quick Evaluation

## 2013-12-18 NOTE — H&P (Signed)
History of Present Illness Ms Kelli Harmon is referred by Dr Shirlean Mylararol Webb. She has been complaining of mild to moderate right flank pain on and off since May 17. She was treated for presumed UTI. She was prescribed antibiotics. However the pain became severe two days ago. She saw her PCP who ordered a CT scan. It revealed a 7 x 4 mm right proximal ureteral calculus and a 9 x 8 mm stone in the lower pole of the left kidney. She was given tramadol and tamsulosin yesterday. She is still in pain. The pain is associated with nausea and radiates to the right lower quadrant. I reviewed the CT scan and the findings are as noted above.   Past Medical History Problems  1. History of Anxiety (300.00) 2. History of asthma (V12.69) 3. History of Seizures (780.39)  Surgical History Problems  1. History of Breast Surgery 2. History of Tonsillectomy  Current Meds 1. APAP-Isometheptene-Dichloral CAPS;  Therapy: (Recorded:25Jun2015) to Recorded 2. Tamsulosin HCl - 0.4 MG Oral Capsule;  Therapy: (Recorded:25Jun2015) to Recorded 3. TraMADol HCl TABS;  Therapy: (Recorded:25Jun2015) to Recorded 4. Wellbutrin TABS;  Therapy: (Recorded:25Jun2015) to Recorded  Allergies Medication  1. Codeine Derivatives  Family History Problems  1. Family history of kidney stones (V18.69) : Maternal Grandfather 2. Family history of Hematuria, gross : Mother, Grandfather  Social History Problems    Alcohol use (V49.89)   Caffeine use (V49.89)   Never a smoker   Single  Review of Systems Genitourinary, constitutional, skin, eye, otolaryngeal, hematologic/lymphatic, cardiovascular, pulmonary, endocrine, musculoskeletal, gastrointestinal, neurological and psychiatric system(s) were reviewed and pertinent findings if present are noted.  Gastrointestinal: nausea and flank pain.    Vitals Vital Signs [Data Includes: Last 1 Day]  Recorded: 25Jun2015 01:04PM  Height: 5 ft 4 in Weight: 182 lb  BMI Calculated:  31.24 BSA Calculated: 1.88 Blood Pressure: 181 / 120 Temperature: 98.4 F Heart Rate: 80  Physical Exam Constitutional: Well nourished and well developed . No acute distress.  ENT:. The ears and nose are normal in appearance.  Neck: The appearance of the neck is normal and no neck mass is present.  Pulmonary: No respiratory distress and normal respiratory rhythm and effort.  Cardiovascular: Heart rate and rhythm are normal . No peripheral edema.  Abdomen: The abdomen is soft and nontender. No masses are palpated. No CVA tenderness. No hernias are palpable. No hepatosplenomegaly noted.  Genitourinary:  The bladder is non tender and not distended.  Lymphatics: The femoral and inguinal nodes are not enlarged or tender.  Skin: Normal skin turgor, no visible rash and no visible skin lesions.  Neuro/Psych:. Mood and affect are appropriate.    Results/Data Urine [Data Includes: Last 1 Day]   25Jun2015  COLOR YELLOW   APPEARANCE CLEAR   SPECIFIC GRAVITY 1.015   pH 6.0   GLUCOSE NEG mg/dL  BILIRUBIN NEG   KETONE NEG mg/dL  BLOOD NEG   PROTEIN TRACE mg/dL  UROBILINOGEN 0.2 mg/dL  NITRITE NEG   LEUKOCYTE ESTERASE NEG    Assessment Assessed  1. Calculus of right ureter (592.1) 2. Calculus of left kidney (592.0)  Plan Calculus of right ureter  1. Start: Oxycodone-Acetaminophen 5-325 MG Oral Tablet; TAKE 1 TO 2 TABLETS EVERY 4  HOURS AS NEEDED FOR PAIN Health Maintenance  2. UA With REFLEX; [Do Not Release]; Status:Resulted - Requires Verification;   Done:  25Jun2015 12:50PM  Since she is symptomatic and the stone is relatively large she needs stone manipulation. The procedure, risks, benefits were  explained to the patient. The risks include but are not limited to hemorrhage, infection, ureteral injury, inability to find the tone or to extract it. She understands and is agreeable.

## 2013-12-18 NOTE — Discharge Instructions (Signed)

## 2013-12-18 NOTE — Anesthesia Procedure Notes (Signed)
Procedure Name: LMA Insertion Date/Time: 12/18/2013 10:15 AM Performed by: Norva PavlovALLAWAY, ROBIN G Pre-anesthesia Checklist: Patient identified, Emergency Drugs available, Suction available and Patient being monitored Patient Re-evaluated:Patient Re-evaluated prior to inductionOxygen Delivery Method: Circle System Utilized Preoxygenation: Pre-oxygenation with 100% oxygen Intubation Type: IV induction Ventilation: Mask ventilation without difficulty LMA: LMA inserted LMA Size: 4.0 Number of attempts: 1 Airway Equipment and Method: bite block Placement Confirmation: positive ETCO2 Tube secured with: Tape Dental Injury: Teeth and Oropharynx as per pre-operative assessment

## 2013-12-18 NOTE — Transfer of Care (Addendum)
Immediate Anesthesia Transfer of Care Note  Patient: Kelli ArgueJill D Pidgeon  Procedure(s) Performed: Procedure(s) (LRB): CYSTOSCOPY WITH RETROGRADE PYELOGRAM, URETEROSCOPY AND STENT PLACEMENT (Right) HOLMIUM LASER APPLICATION (Right)  Patient Location: PACU  Anesthesia Type: General  Level of Consciousness: sleepy  Airway & Oxygen Therapy: Patient Spontanous Breathing and Patient connected to nasal cannula oxygen, oral airway remaining  Post-op Assessment: Report given to PACU RN and Post -op Vital signs reviewed and stable  Post vital signs: Reviewed and stable  Complications: No apparent anesthesia complications

## 2013-12-21 ENCOUNTER — Encounter (HOSPITAL_BASED_OUTPATIENT_CLINIC_OR_DEPARTMENT_OTHER): Payer: Self-pay | Admitting: Urology

## 2013-12-21 LAB — POCT HEMOGLOBIN-HEMACUE: Hemoglobin: 13.5 g/dL (ref 12.0–15.0)

## 2013-12-21 NOTE — Anesthesia Postprocedure Evaluation (Signed)
Anesthesia Post Note  Patient: Kelli Harmon  Procedure(s) Performed: Procedure(s) (LRB): CYSTOSCOPY WITH RETROGRADE PYELOGRAM, URETEROSCOPY AND STENT PLACEMENT (Right) HOLMIUM LASER APPLICATION (Right)  Anesthesia type: General  Patient location: PACU  Post pain: Pain level controlled  Post assessment: Post-op Vital signs reviewed  Last Vitals:  Filed Vitals:   12/18/13 1305  BP: 155/89  Pulse: 83  Temp: 36.4 C  Resp: 16    Post vital signs: Reviewed  Level of consciousness: sedated  Complications: No apparent anesthesia complications

## 2014-02-17 ENCOUNTER — Other Ambulatory Visit: Payer: Self-pay | Admitting: Urology

## 2014-03-11 ENCOUNTER — Encounter (HOSPITAL_COMMUNITY): Payer: Self-pay | Admitting: Pharmacy Technician

## 2014-03-15 NOTE — Patient Instructions (Addendum)
Kelli Harmon  03/15/2014                           YOUR PROCEDURE IS SCHEDULED ON:  03/19/14               ENTER THRU [facility] ENTRANCE AND                            FOLLOW  SIGNS TO SHORT STAY CENTER                 ARRIVE AT SHORT STAY AT:  5:30 am               CALL THIS NUMBER IF ANY PROBLEMS THE DAY OF SURGERY :               [phone]                                REMEMBER:   Do not eat food or drink liquids AFTER MIDNIGHT                  Take these medicines the morning of surgery with               A SIPS OF WATER :   MAY TAKE TRAMADOL IF NEEDED / BRING INHALER      Do not wear jewelry, make-up   Do not wear lotions, powders, or perfumes.   Do not shave legs or underarms 12 hrs. before surgery (men may shave face)  Do not bring valuables to the hospital.  Contacts, dentures or bridgework may not be worn into surgery.  Leave suitcase in the car. After surgery it may be brought to your room.  For patients admitted to the hospital more than one night, checkout time is            11:00 AM                                                       The day of discharge.   Patients discharged the day of surgery will not be allowed to drive home.            If going home same day of surgery, must have someone stay with you              FIRST 24 hrs at home and arrange for some one to drive you              home from hospital.   ________________________________________________________________________                                                                                                  [facility] - PREPARING FOR SURGERY  Before surgery, you  can play an important role.  Because skin is not sterile, your skin needs to be as free of germs as possible.  You can reduce the number of germs on your skin by washing with CHG (chlorahexidine gluconate) soap before surgery.  CHG is an antiseptic cleaner which kills germs and bonds with the  skin to continue killing germs even after washing. Please DO NOT use if you have an allergy to CHG or antibacterial soaps.  If your skin becomes reddened/irritated stop using the CHG and inform your nurse when you arrive at Short Stay. Do not shave (including legs and underarms) for at least 48 hours prior to the first CHG shower.  You may shave your face. Please follow these instructions carefully:   1.  Shower with CHG Soap the night before surgery and the  morning of Surgery.   2.  If you choose to wash your hair, wash your hair first as usual with your  normal  Shampoo.   3.  After you shampoo, rinse your hair and body thoroughly to remove the  shampoo.                                         4.  Use CHG as you would any other liquid soap.  You can apply chg directly  to the skin and wash . Gently wash with scrungie or clean wascloth    5.  Apply the CHG Soap to your body ONLY FROM THE NECK DOWN.   Do not use on open                           Wound or open sores. Avoid contact with eyes, ears mouth and genitals (private parts).                        Genitals (private parts) with your normal soap.              6.  Wash thoroughly, paying special attention to the area where your surgery  will be performed.   7.  Thoroughly rinse your body with warm water from the neck down.   8.  DO NOT shower/wash with your normal soap after using and rinsing off  the CHG Soap .                9.  Pat yourself dry with a clean towel.             10.  Wear clean pajamas.             11.  Place clean sheets on your bed the night of your first shower and do not  sleep with pets.  Day of Surgery : Do not apply any lotions/deodorants the morning of surgery.  Please wear clean clothes to the hospital/surgery center.  FAILURE TO FOLLOW THESE INSTRUCTIONS MAY RESULT IN THE CANCELLATION OF YOUR SURGERY    PATIENT  SIGNATURE_________________________________  ______________________________________________________________________

## 2014-03-16 ENCOUNTER — Emergency Department (HOSPITAL_COMMUNITY)
Admission: EM | Admit: 2014-03-16 | Discharge: 2014-03-17 | Disposition: A | Discharge status: Home / Self Care | Payer: BC Managed Care – PPO | Attending: Emergency Medicine | Admitting: Emergency Medicine

## 2014-03-16 ENCOUNTER — Encounter (HOSPITAL_COMMUNITY): Payer: Self-pay

## 2014-03-16 ENCOUNTER — Encounter (HOSPITAL_COMMUNITY): Payer: Self-pay | Admitting: Emergency Medicine

## 2014-03-16 ENCOUNTER — Encounter (INDEPENDENT_AMBULATORY_CARE_PROVIDER_SITE_OTHER): Payer: Self-pay

## 2014-03-16 ENCOUNTER — Encounter (HOSPITAL_COMMUNITY)
Admission: RE | Admit: 2014-03-16 | Discharge: 2014-03-16 | Disposition: A | Discharge status: Home / Self Care | Payer: BC Managed Care – PPO | Source: Ambulatory Visit | Attending: Urology | Admitting: Urology

## 2014-03-16 ENCOUNTER — Emergency Department (HOSPITAL_COMMUNITY): Payer: BC Managed Care – PPO

## 2014-03-16 DIAGNOSIS — J45909 Unspecified asthma, uncomplicated: Secondary | ICD-10-CM | POA: Insufficient documentation

## 2014-03-16 DIAGNOSIS — F411 Generalized anxiety disorder: Secondary | ICD-10-CM | POA: Insufficient documentation

## 2014-03-16 DIAGNOSIS — Z87442 Personal history of urinary calculi: Secondary | ICD-10-CM | POA: Insufficient documentation

## 2014-03-16 DIAGNOSIS — Z87448 Personal history of other diseases of urinary system: Secondary | ICD-10-CM | POA: Insufficient documentation

## 2014-03-16 DIAGNOSIS — Z862 Personal history of diseases of the blood and blood-forming organs and certain disorders involving the immune mechanism: Secondary | ICD-10-CM | POA: Insufficient documentation

## 2014-03-16 DIAGNOSIS — R079 Chest pain, unspecified: Secondary | ICD-10-CM

## 2014-03-16 DIAGNOSIS — Z791 Long term (current) use of non-steroidal anti-inflammatories (NSAID): Secondary | ICD-10-CM | POA: Insufficient documentation

## 2014-03-16 DIAGNOSIS — K219 Gastro-esophageal reflux disease without esophagitis: Secondary | ICD-10-CM | POA: Insufficient documentation

## 2014-03-16 DIAGNOSIS — R0781 Pleurodynia: Secondary | ICD-10-CM

## 2014-03-16 DIAGNOSIS — J189 Pneumonia, unspecified organism: Secondary | ICD-10-CM

## 2014-03-16 DIAGNOSIS — R0789 Other chest pain: Secondary | ICD-10-CM | POA: Diagnosis not present

## 2014-03-16 DIAGNOSIS — Z8639 Personal history of other endocrine, nutritional and metabolic disease: Secondary | ICD-10-CM | POA: Insufficient documentation

## 2014-03-16 DIAGNOSIS — Z79899 Other long term (current) drug therapy: Secondary | ICD-10-CM | POA: Insufficient documentation

## 2014-03-16 HISTORY — DX: Other allergy status, other than to drugs and biological substances: Z91.09

## 2014-03-16 HISTORY — DX: Headache: R51

## 2014-03-16 LAB — BASIC METABOLIC PANEL
ANION GAP: 14 (ref 5–15)
BUN: 12 mg/dL (ref 6–23)
CO2: 22 mEq/L (ref 19–32)
CREATININE: 0.81 mg/dL (ref 0.50–1.10)
Calcium: 9 mg/dL (ref 8.4–10.5)
Chloride: 99 mEq/L (ref 96–112)
GFR calc non Af Amer: 89 mL/min — ABNORMAL LOW (ref >90)
Glucose, Bld: 107 mg/dL — ABNORMAL HIGH (ref 70–99)
Potassium: 4.1 mEq/L (ref 3.7–5.3)
Sodium: 135 mEq/L — ABNORMAL LOW (ref 137–147)

## 2014-03-16 LAB — CBC
HCT: 41 % (ref 36.0–46.0)
Hemoglobin: 13.7 g/dL (ref 12.0–15.0)
MCH: 30.5 pg (ref 26.0–34.0)
MCHC: 33.4 g/dL (ref 30.0–36.0)
MCV: 91.3 fL (ref 78.0–100.0)
Platelets: 218 10*3/uL (ref 150–400)
RBC: 4.49 MIL/uL (ref 3.87–5.11)
RDW: 13.1 % (ref 11.5–15.5)
WBC: 6.6 10*3/uL (ref 4.0–10.5)

## 2014-03-16 LAB — PROTIME-INR
INR: 0.91 (ref 0.00–1.49)
Prothrombin Time: 12.3 seconds (ref 11.6–15.2)

## 2014-03-16 LAB — HCG, SERUM, QUALITATIVE: PREG SERUM: NEGATIVE

## 2014-03-16 LAB — CBC WITH DIFFERENTIAL/PLATELET
BASOS PCT: 0 % (ref 0–1)
Basophils Absolute: 0 10*3/uL (ref 0.0–0.1)
EOS PCT: 4 % (ref 0–5)
Eosinophils Absolute: 0.2 10*3/uL (ref 0.0–0.7)
HEMATOCRIT: 41.7 % (ref 36.0–46.0)
Hemoglobin: 13.9 g/dL (ref 12.0–15.0)
Lymphocytes Relative: 14 % (ref 12–46)
Lymphs Abs: 0.9 10*3/uL (ref 0.7–4.0)
MCH: 30.4 pg (ref 26.0–34.0)
MCHC: 33.3 g/dL (ref 30.0–36.0)
MCV: 91.2 fL (ref 78.0–100.0)
MONO ABS: 0.5 10*3/uL (ref 0.1–1.0)
Monocytes Relative: 9 % (ref 3–12)
Neutro Abs: 4.4 10*3/uL (ref 1.7–7.7)
Neutrophils Relative %: 73 % (ref 43–77)
Platelets: 210 10*3/uL (ref 150–400)
RBC: 4.57 MIL/uL (ref 3.87–5.11)
RDW: 13.1 % (ref 11.5–15.5)
WBC: 6.1 10*3/uL (ref 4.0–10.5)

## 2014-03-16 MED ORDER — IOHEXOL 350 MG/ML SOLN
100.0000 mL | Freq: Once | INTRAVENOUS | Status: AC | PRN
  Administered 2014-03-16: 100 mL via INTRAVENOUS

## 2014-03-16 NOTE — ED Provider Notes (Signed)
CSN: [id number]     Arrival date & time 03/16/14  1901 History   First MD Initiated Contact with Patient 03/16/14 1950     Chief Complaint  Patient presents with  . Pulmonary Embolus       HPI  Patient presents after an outpatient CT scan suggests possible pulmonary embolus. Patient had a cystoscopy, stone extraction, and left ureteral stent placement in June. Has intermittently had symptoms of pain in her right side. Has a known large left renal stone. Scheduled for cystoscopy and stone extraction on Friday. Started having intermittent episodes of right flank and right lower posterior chest pain. An outpatient noncontrast CT performed via her urologist suggests possible right lower lobe pulmonary embolus. She presents here. He denies right flank pain currently. She does not feel short of breath. She states she passed some sand in a few days ago and alleges he may have passed a kidney stone. However she did have some pleuritic pain there yesterday. Not febrile, slightly tachycardic, not hypoxemic. No history of stones. No leg pain or swelling. No family history of stones  Past Medical History  Diagnosis Date  . Anxiety   . Right ureteral stone   . Left nephrolithiasis   . Asthma, exercise induced   . History of hypothyroidism     early age 43's -- no issues since  . GERD (gastroesophageal reflux disease)   . Urgency of urination   . Hematuria   . Wears contact lenses   . Headache(784.0)     MIGRAINES  . Environmental allergies    Past Surgical History  Procedure Laterality Date  . Cervical biopsy  w/ loop electrode excision  2000    Normal  . Tonsillectomy  1983  . Breast reduction surgery Bilateral 09-14-2003  . Cystoscopy with retrograde pyelogram, ureteroscopy and stent placement Right 12/18/2013    Procedure: CYSTOSCOPY WITH RETROGRADE PYELOGRAM, URETEROSCOPY AND STENT PLACEMENT;  Surgeon: Danae Chen, MD;  Location: [facility];  Service: Urology;  Laterality:  Right;  . Holmium laser application Right 12/18/2013    Procedure: HOLMIUM LASER APPLICATION;  Surgeon: Danae Chen, MD;  Location: [facility];  Service: Urology;  Laterality: Right;   No family history on file. History  Substance Use Topics  . Smoking status: Never Smoker   . Smokeless tobacco: Not on file  . Alcohol Use: Yes     Comment: occasional   OB History   Grav Para Term Preterm Abortions TAB SAB Ect Mult Living       Review of Systems  Constitutional: Negative for fever, chills, diaphoresis, appetite change and fatigue.  HENT: Negative for mouth sores, sore throat and trouble swallowing.   Eyes: Negative for visual disturbance.  Respiratory: Negative for cough, chest tightness, shortness of breath and wheezing.   Cardiovascular: Negative for chest pain.  Gastrointestinal: Negative for nausea, vomiting, abdominal pain, diarrhea and abdominal distention.  Endocrine: Negative for polydipsia, polyphagia and polyuria.  Genitourinary: Positive for flank pain. Negative for dysuria, frequency and hematuria.  Musculoskeletal: Positive for back pain. Negative for gait problem.  Skin: Negative for color change, pallor and rash.  Neurological: Negative for dizziness, syncope, light-headedness and headaches.  Hematological: Does not bruise/bleed easily.  Psychiatric/Behavioral: Negative for behavioral problems and confusion.      Allergies  Codeine  Home Medications   Prior to Admission medications   Medication Sig Start Date End Date Taking? Authorizing  Provider  albuterol (PROVENTIL HFA;VENTOLIN HFA) 108 (90 BASE) MCG/ACT inhaler Inhale 2 puffs into the lungs every 6 (six) hours as needed for wheezing or shortness of breath.   Yes Historical Provider, MD  Ibuprofen (MIDOL) 200 MG CAPS Take 1 capsule by mouth once as needed (cramps).    Yes Historical Provider, MD  isometheptene-acetaminophen-dichloralphenazone (MIDRIN) 65-325-100 MG  capsule Take 1 capsule by mouth once as needed for migraine. Maximum 5 capsules in 12 hours for migraine headaches, 8 capsules in 24 hours for tension headaches.   Yes Historical Provider, MD  MELATONIN PO Take 1 tablet by mouth at bedtime as needed (sleep).    Yes Historical Provider, MD  Multiple Vitamins-Minerals (MULTIVITAMIN PO) Take 1 tablet by mouth daily.    Yes Historical Provider, MD  naproxen sodium (ALEVE) 220 MG tablet Take 220 mg by mouth daily as needed (headache).   Yes Historical Provider, MD  omeprazole (PRILOSEC) 20 MG capsule Take 20 mg by mouth once as needed (indigestion.).    Yes Historical Provider, MD  tamsulosin (FLOMAX) 0.4 MG CAPS capsule Take 0.4 mg by mouth every evening.   Yes Historical Provider, MD  traMADol (ULTRAM) 50 MG tablet Take 50 mg by mouth every 6 (six) hours as needed for moderate pain.    Yes Historical Provider, MD  azithromycin (ZITHROMAX) 250 MG tablet Take 1 tablet (250 mg total) by mouth daily. Take first 2 tablets together, then 1 every day until finished. 03/17/14   Shon Baton, MD   BP 163/103  Pulse 101  Temp(Src) 99 F (37.2 C) (Oral)  Resp 16  Ht  (1.626 m)  Wt 188 lb (85.276 kg)  BMI 32.25 kg/m2  SpO2 100%  LMP 03/01/2014 Physical Exam  Constitutional: She is oriented to person, place, and time. She appears well-developed and well-nourished. No distress.  HENT:  Head: Normocephalic.  Eyes: Conjunctivae are normal. Pupils are equal, round, and reactive to light. No scleral icterus.  Neck: Normal range of motion. Neck supple. No thyromegaly present.  Cardiovascular: Normal rate and regular rhythm.  Exam reveals no gallop and no friction rub.   No murmur heard. Pulmonary/Chest: Effort normal and breath sounds normal. No respiratory distress. She has no wheezes. She has no rales.  Abdominal: Soft. Bowel sounds are normal. She exhibits no distension. There is no tenderness. There is no rebound.  Musculoskeletal: Normal range  of motion.  Neurological: She is alert and oriented to person, place, and time.  Skin: Skin is warm and dry. No rash noted.  Psychiatric: She has a normal mood and affect. Her behavior is normal.    ED Course  Procedures (including critical care time) Labs Review Labs Reviewed  BASIC METABOLIC PANEL - Abnormal; Notable for the following:    Sodium 135 (*)    Glucose, Bld 107 (*)    GFR calc non Af Amer 89 (*)    All other components within normal limits  CBC WITH DIFFERENTIAL  PROTIME-INR    Imaging Review Ct Angio Chest Pe W/cm &/or Wo Cm  03/16/2014   CLINICAL DATA:  Right posterior chest pain. Abnormality a and right lower lobe on preceding abdominal CT.  EXAM: CT ANGIOGRAPHY CHEST WITH CONTRAST  TECHNIQUE: Multidetector CT imaging of the chest was performed using the standard protocol during bolus administration of intravenous contrast. Multiplanar CT image reconstructions and MIPs were obtained to evaluate the vascular anatomy.  CONTRAST:  OMNIPAQUE IOHEXOL 350 MG/ML SOLN  COMPARISON:  None.  FINDINGS: THORACIC INLET/BODY WALL:  No acute abnormality.  MEDIASTINUM:  Normal heart size. No pericardial effusion. No acute vascular abnormality, including pulmonary embolism. Prominence of right hilar and lower mediastinal lymph nodes is likely reactive to the pulmonary process.  LUNG WINDOWS:  Ground-glass and consolidative airspace disease in the right lower lobe, roughly segmental in distribution. No effusion or cavitation.  UPPER ABDOMEN:  Subtle 17 mm low-density near the central liver is likely focal fat based on preceding noncontrast examination.  OSSEOUS:  No acute fracture.  No suspicious lytic or blastic lesions.  Review of the MIP images confirms the above findings.  IMPRESSION: 1. Negative for pulmonary embolism. 2. Right lower lobe airspace disease, likely pneumonia. Recommend chest x-ray follow-up six weeks after treatment.   Electronically Signed   By: Tiburcio Pea M.D.    On: 03/16/2014 23:59     EKG Interpretation None      MDM   Final diagnoses:  Pleuritic chest pain  Community acquired pneumonia    CT scan pending. Has normal renal function. Did not receive IV contrast for her earlier CT scan today.    Rolland Porter, MD 03/18/14 [phone]

## 2014-03-16 NOTE — ED Notes (Signed)
Pt is scheduled for Surgery on Friday for kidney stones; pt states that she was having a pre-operative work up due to continued pain on the right side; pt states that she had a CT scan today around 3pm and was called this evening by the MD (Dr Brunilda Payor) and the RN and was advised that she had a PE and needed to come here for evaluation; pt states that she has been short of breath for a few days but contributed it to a recent cold; pt states that she is anxious about the diagnosis

## 2014-03-16 NOTE — ED Notes (Signed)
Pt states that the CT scan was done at [facility] this afternoon

## 2014-03-17 MED ORDER — AZITHROMYCIN 250 MG PO TABS: 250.0000 mg | ORAL_TABLET | Freq: Every day | ORAL | Status: DC

## 2014-03-17 NOTE — ED Provider Notes (Signed)
Signed out by Dr. Fayrene Fearing.  Pending CTA to rule out PE.  CTA negative for PE but is suspicious for airspace disease. Discussed with patient. Patient states that she's developed a cough since Saturday. She denies any fevers. No recent inpatient hospitalizations but did have a surgery as an outpatient in June of this year.  Given changes on CT and cough, Will place patient on azithromycin. She'll followup with urology as already scheduled.  After history, exam, and medical workup I feel the patient has been appropriately medically screened and is safe for discharge home. Pertinent diagnoses were discussed with the patient. Patient was given return precautions.   Results for orders placed during the hospital encounter of 03/16/14  CBC WITH DIFFERENTIAL      Result Value Ref Range   WBC 6.1  4.0 - 10.5 K/uL   RBC 4.57  3.87 - 5.11 MIL/uL   Hemoglobin 13.9  12.0 - 15.0 g/dL   HCT 16.1  09.6 - 04.5 %   MCV 91.2  78.0 - 100.0 fL   MCH 30.4  26.0 - 34.0 pg   MCHC 33.3  30.0 - 36.0 g/dL   RDW 40.9  81.1 - 91.4 %   Platelets 210  150 - 400 K/uL   Neutrophils Relative % 73  43 - 77 %   Neutro Abs 4.4  1.7 - 7.7 K/uL   Lymphocytes Relative 14  12 - 46 %   Lymphs Abs 0.9  0.7 - 4.0 K/uL   Monocytes Relative 9  3 - 12 %   Monocytes Absolute 0.5  0.1 - 1.0 K/uL   Eosinophils Relative 4  0 - 5 %   Eosinophils Absolute 0.2  0.0 - 0.7 K/uL   Basophils Relative 0  0 - 1 %   Basophils Absolute 0.0  0.0 - 0.1 K/uL  BASIC METABOLIC PANEL      Result Value Ref Range   Sodium 135 (*) 137 - 147 mEq/L   Potassium 4.1  3.7 - 5.3 mEq/L   Chloride 99  96 - 112 mEq/L   CO2 22  19 - 32 mEq/L   Glucose, Bld 107 (*) 70 - 99 mg/dL   BUN 12  6 - 23 mg/dL   Creatinine, Ser 7.82  0.50 - 1.10 mg/dL   Calcium 9.0  8.4 - 95.6 mg/dL   GFR calc non Af Amer 89 (*) >90 mL/min   GFR calc Af Amer >90  >90 mL/min   Anion gap 14  5 - 15  PROTIME-INR      Result Value Ref Range   Prothrombin Time 12.3  11.6 - 15.2  seconds   INR 0.91  0.00 - 1.49   Ct Angio Chest Pe W/cm &/or Wo Cm  03/16/2014   CLINICAL DATA:  Right posterior chest pain. Abnormality a and right lower lobe on preceding abdominal CT.  EXAM: CT ANGIOGRAPHY CHEST WITH CONTRAST  TECHNIQUE: Multidetector CT imaging of the chest was performed using the standard protocol during bolus administration of intravenous contrast. Multiplanar CT image reconstructions and MIPs were obtained to evaluate the vascular anatomy.  CONTRAST:  OMNIPAQUE IOHEXOL 350 MG/ML SOLN  COMPARISON:  None.  FINDINGS: THORACIC INLET/BODY WALL:  No acute abnormality.  MEDIASTINUM:  Normal heart size. No pericardial effusion. No acute vascular abnormality, including pulmonary embolism. Prominence of right hilar and lower mediastinal lymph nodes is likely reactive to the pulmonary process.  LUNG WINDOWS:  Ground-glass and consolidative airspace disease in the right  lower lobe, roughly segmental in distribution. No effusion or cavitation.  UPPER ABDOMEN:  Subtle 17 mm low-density near the central liver is likely focal fat based on preceding noncontrast examination.  OSSEOUS:  No acute fracture.  No suspicious lytic or blastic lesions.  Review of the MIP images confirms the above findings.  IMPRESSION: 1. Negative for pulmonary embolism. 2. Right lower lobe airspace disease, likely pneumonia. Recommend chest x-ray follow-up six weeks after treatment.   Electronically Signed   By: Tiburcio Pea M.D.   On: 03/16/2014 23:59      Shon Baton, MD 03/17/14 [phone]

## 2014-03-17 NOTE — Discharge Instructions (Signed)

## 2014-03-26 ENCOUNTER — Telehealth: Payer: Self-pay | Admitting: *Deleted

## 2014-03-26 NOTE — Telephone Encounter (Signed)
S/w re: scheduling AEX and repeat pap Recall 8. - patient stated she couldn't schedule that right now, she states she will call later.  Dr Nena PolioLathrop FYI. CC: Kelli Harmon

## 2014-04-15 NOTE — Telephone Encounter (Signed)
Patient hasn't call back to schedule appt. She is recall 8.  Should we send letter and then take out of recalls? - Dr. Hyacinth MeekerMiller  CC: Kennon RoundsSally

## 2014-04-16 ENCOUNTER — Other Ambulatory Visit: Payer: Self-pay | Admitting: Urology

## 2014-04-19 ENCOUNTER — Encounter (HOSPITAL_COMMUNITY): Payer: Self-pay

## 2014-04-19 ENCOUNTER — Encounter (INDEPENDENT_AMBULATORY_CARE_PROVIDER_SITE_OTHER): Payer: Self-pay

## 2014-04-19 ENCOUNTER — Encounter (HOSPITAL_COMMUNITY): Payer: Self-pay | Admitting: Pharmacy Technician

## 2014-04-19 ENCOUNTER — Ambulatory Visit (HOSPITAL_COMMUNITY)
Admission: RE | Admit: 2014-04-19 | Discharge: 2014-04-19 | Disposition: A | Discharge status: Home / Self Care | Payer: BC Managed Care – PPO | Source: Ambulatory Visit | Attending: Anesthesiology | Admitting: Anesthesiology

## 2014-04-19 ENCOUNTER — Encounter (HOSPITAL_COMMUNITY)
Admission: RE | Admit: 2014-04-19 | Discharge: 2014-04-19 | Disposition: A | Discharge status: Home / Self Care | Payer: BC Managed Care – PPO | Source: Ambulatory Visit | Attending: Urology | Admitting: Urology

## 2014-04-19 DIAGNOSIS — N200 Calculus of kidney: Secondary | ICD-10-CM | POA: Insufficient documentation

## 2014-04-19 DIAGNOSIS — Z01818 Encounter for other preprocedural examination: Secondary | ICD-10-CM | POA: Diagnosis present

## 2014-04-19 DIAGNOSIS — J189 Pneumonia, unspecified organism: Secondary | ICD-10-CM

## 2014-04-19 HISTORY — DX: Shortness of breath: R06.02

## 2014-04-19 HISTORY — DX: Pneumonia, unspecified organism: J18.9

## 2014-04-19 LAB — CBC
HCT: 41.4 % (ref 36.0–46.0)
Hemoglobin: 13.9 g/dL (ref 12.0–15.0)
MCH: 30.3 pg (ref 26.0–34.0)
MCHC: 33.6 g/dL (ref 30.0–36.0)
MCV: 90.2 fL (ref 78.0–100.0)
PLATELETS: 280 10*3/uL (ref 150–400)
RBC: 4.59 MIL/uL (ref 3.87–5.11)
RDW: 12.7 % (ref 11.5–15.5)
WBC: 8.3 10*3/uL (ref 4.0–10.5)

## 2014-04-19 LAB — BASIC METABOLIC PANEL
ANION GAP: 13 (ref 5–15)
BUN: 20 mg/dL (ref 6–23)
CALCIUM: 9.4 mg/dL (ref 8.4–10.5)
CO2: 24 mEq/L (ref 19–32)
Chloride: 101 mEq/L (ref 96–112)
Creatinine, Ser: 0.7 mg/dL (ref 0.50–1.10)
GFR calc non Af Amer: >90 mL/min (ref >90)
Glucose, Bld: 101 mg/dL — ABNORMAL HIGH (ref 70–99)
Potassium: 4.5 mEq/L (ref 3.7–5.3)
Sodium: 138 mEq/L (ref 137–147)

## 2014-04-19 LAB — HCG, SERUM, QUALITATIVE: Preg, Serum: NEGATIVE

## 2014-04-19 NOTE — Patient Instructions (Signed)
21 Kelli Harmon  04/19/2014   Your procedure is scheduled on:     04/23/2014               [facility] and follow signs to  Short Stay Center arrive at 0530 AM.   Call this number if you have problems the morning of surgery [phone] or Presurgical Testing [phone].   Remember:  Do not eat food or drink liquids :After Midnight.   Take these medicines the morning of surgery with A SIP OF WATER: NONE                               You may not have any metal on your body including hair pins and piercings  Do not wear jewelry, make-up, lotions, powders, or deodorant.  Do not shave body hair  48 hours(2 days) of CHG soap use               Do not bring valuables to the hospital. [facility] IS NOT RESPONSIBLE FOR VALUABLES.  Contacts, dentures or bridgework may not be worn into surgery.     Patients discharged the day of surgery will not be allowed to drive home.  Name and phone number of your driver:Kelli Harmon (mother) ________________________________________________________________________  [facility] - Preparing for Surgery Before surgery, you can play an important role.  Because skin is not sterile, your skin needs to be as free of germs as possible.  You can reduce the number of germs on your skin by washing with CHG (chlorahexidine gluconate) soap before surgery.  CHG is an antiseptic cleaner which kills germs and bonds with the skin to continue killing germs even after washing. Please DO NOT use if you have an allergy to CHG or antibacterial soaps.  If your skin becomes reddened/irritated stop using the CHG and inform your nurse when you arrive at Short Stay. Do not shave (including legs and underarms) for at least 48 hours prior to the first CHG shower.  You may shave your face/neck. Please follow these instructions carefully:  1.  Shower with CHG Soap the night before surgery and the  morning of Surgery.  2.  If you choose to wash your  hair, wash your hair first as usual with your  normal  shampoo.  3.  After you shampoo, rinse your hair and body thoroughly to remove the  shampoo.                           4.  Use CHG as you would any other liquid soap.  You can apply chg directly  to the skin and wash                       Gently with a scrungie or clean washcloth.  5.  Apply the CHG Soap to your body ONLY FROM THE NECK DOWN.   Do not use on face/ open                           Wound or open sores. Avoid contact with eyes, ears mouth and genitals (private parts).                       Wash face,  Genitals (private parts) with your normal soap.  6.  Wash thoroughly, paying special attention to the area where your surgery  will be performed.  7.  Thoroughly rinse your body with warm water from the neck down.  8.  DO NOT shower/wash with your normal soap after using and rinsing off  the CHG Soap.                9.  Pat yourself dry with a clean towel.            10.  Wear clean pajamas.            11.  Place clean sheets on your bed the night of your first shower and do not  sleep with pets. Day of Surgery : Do not apply any lotions/deodorants the morning of surgery.  Please wear clean clothes to the hospital/surgery center.  FAILURE TO FOLLOW THESE INSTRUCTIONS MAY RESULT IN THE CANCELLATION OF YOUR SURGERY PATIENT SIGNATURE_________________________________  NURSE SIGNATURE__________________________________  ________________________________________________________________________

## 2014-04-22 ENCOUNTER — Encounter (HOSPITAL_COMMUNITY): Payer: Self-pay | Admitting: Anesthesiology

## 2014-04-22 NOTE — Anesthesia Preprocedure Evaluation (Signed)
Anesthesia Evaluation  Patient identified by MRN, date of birth, ID band Patient awake    Reviewed: Allergy & Precautions, H&P , NPO status , Patient's Chart, lab work & pertinent test results  Airway Mallampati: II  TM Distance: >3 FB Neck ROM: Full    Dental no notable dental hx.    Pulmonary shortness of breath, asthma , pneumonia -, resolved,  breath sounds clear to auscultation  Pulmonary exam normal       Cardiovascular negative cardio ROS  Rhythm:Regular Rate:Normal     Neuro/Psych  Headaches, negative psych ROS   GI/Hepatic Neg liver ROS, GERD-  ,  Endo/Other  negative endocrine ROS  Renal/GU Renal disease  negative genitourinary   Musculoskeletal negative musculoskeletal ROS (+)   Abdominal (+) + obese,   Peds negative pediatric ROS (+)  Hematology negative hematology ROS (+)   Anesthesia Other Findings   Reproductive/Obstetrics negative OB ROS                             Anesthesia Physical Anesthesia Plan  ASA: II  Anesthesia Plan: General   Post-op Pain Management:    Induction: Intravenous  Airway Management Planned: LMA  Additional Equipment:   Intra-op Plan:   Post-operative Plan: Extubation in OR  Informed Consent: I have reviewed the patients History and Physical, chart, labs and discussed the procedure including the risks, benefits and alternatives for the proposed anesthesia with the patient or authorized representative who has indicated his/her understanding and acceptance.   Dental advisory given  Plan Discussed with: CRNA  Anesthesia Plan Comments:         Anesthesia Quick Evaluation

## 2014-04-23 ENCOUNTER — Encounter (HOSPITAL_COMMUNITY): Payer: BC Managed Care – PPO | Admitting: Anesthesiology

## 2014-04-23 ENCOUNTER — Ambulatory Visit (HOSPITAL_COMMUNITY): Payer: BC Managed Care – PPO | Admitting: Anesthesiology

## 2014-04-23 ENCOUNTER — Encounter (HOSPITAL_COMMUNITY): Payer: Self-pay | Admitting: Anesthesiology

## 2014-04-23 ENCOUNTER — Encounter (HOSPITAL_COMMUNITY)
Admission: RE | Disposition: A | Discharge status: Home / Self Care | Payer: Self-pay | Source: Ambulatory Visit | Attending: Urology

## 2014-04-23 ENCOUNTER — Ambulatory Visit (HOSPITAL_COMMUNITY)
Admission: RE | Admit: 2014-04-23 | Discharge: 2014-04-23 | Disposition: A | Discharge status: Home / Self Care | Payer: BC Managed Care – PPO | Source: Ambulatory Visit | Attending: Urology | Admitting: Urology

## 2014-04-23 ENCOUNTER — Encounter: Payer: Self-pay | Admitting: Obstetrics & Gynecology

## 2014-04-23 DIAGNOSIS — J45909 Unspecified asthma, uncomplicated: Secondary | ICD-10-CM | POA: Insufficient documentation

## 2014-04-23 DIAGNOSIS — R569 Unspecified convulsions: Secondary | ICD-10-CM | POA: Insufficient documentation

## 2014-04-23 DIAGNOSIS — N200 Calculus of kidney: Secondary | ICD-10-CM

## 2014-04-23 DIAGNOSIS — F419 Anxiety disorder, unspecified: Secondary | ICD-10-CM | POA: Insufficient documentation

## 2014-04-23 DIAGNOSIS — N390 Urinary tract infection, site not specified: Secondary | ICD-10-CM | POA: Diagnosis not present

## 2014-04-23 HISTORY — PX: CYSTOSCOPY WITH URETEROSCOPY AND STENT PLACEMENT: SHX6377

## 2014-04-23 HISTORY — PX: HOLMIUM LASER APPLICATION: SHX5852

## 2014-04-23 SURGERY — CYSTOURETEROSCOPY, WITH STENT INSERTION
Anesthesia: General | Laterality: Left

## 2014-04-23 MED ORDER — BELLADONNA ALKALOIDS-OPIUM 16.2-60 MG RE SUPP
RECTAL | Status: DC | PRN
  Administered 2014-04-23: 1 via RECTAL

## 2014-04-23 MED ORDER — HYDROCODONE-ACETAMINOPHEN 5-325 MG PO TABS: 1.0000 | ORAL_TABLET | Freq: Four times a day (QID) | ORAL | Status: DC | PRN

## 2014-04-23 MED ORDER — IOHEXOL 300 MG/ML  SOLN
INTRAMUSCULAR | Status: DC | PRN
  Administered 2014-04-23: 7 mL via URETHRAL

## 2014-04-23 MED ORDER — PROPOFOL 10 MG/ML IV BOLUS
INTRAVENOUS | Status: AC
  Filled 2014-04-23: qty 20

## 2014-04-23 MED ORDER — ONDANSETRON HCL 4 MG/2ML IJ SOLN
INTRAMUSCULAR | Status: AC
  Filled 2014-04-23: qty 2

## 2014-04-23 MED ORDER — DEXAMETHASONE SODIUM PHOSPHATE 10 MG/ML IJ SOLN
INTRAMUSCULAR | Status: DC | PRN
  Administered 2014-04-23: 10 mg via INTRAVENOUS

## 2014-04-23 MED ORDER — FENTANYL CITRATE 0.05 MG/ML IJ SOLN
INTRAMUSCULAR | Status: AC
  Filled 2014-04-23: qty 2

## 2014-04-23 MED ORDER — FENTANYL CITRATE 0.05 MG/ML IJ SOLN
INTRAMUSCULAR | Status: AC
  Filled 2014-04-23: qty 5

## 2014-04-23 MED ORDER — LIDOCAINE HCL 2 % EX GEL
CUTANEOUS | Status: AC
  Filled 2014-04-23: qty 10

## 2014-04-23 MED ORDER — LIDOCAINE HCL 2 % EX GEL
CUTANEOUS | Status: DC | PRN
  Administered 2014-04-23: 1 via URETHRAL

## 2014-04-23 MED ORDER — FENTANYL CITRATE 0.05 MG/ML IJ SOLN
INTRAMUSCULAR | Status: DC | PRN
  Administered 2014-04-23: 100 ug via INTRAVENOUS
  Administered 2014-04-23 (×2): 50 ug via INTRAVENOUS

## 2014-04-23 MED ORDER — LIDOCAINE HCL 1 % IJ SOLN
INTRAMUSCULAR | Status: DC | PRN
  Administered 2014-04-23: 80 mg via INTRADERMAL

## 2014-04-23 MED ORDER — PHENAZOPYRIDINE HCL 200 MG PO TABS: 200.0000 mg | ORAL_TABLET | Freq: Three times a day (TID) | ORAL | Status: DC | PRN

## 2014-04-23 MED ORDER — FENTANYL CITRATE 0.05 MG/ML IJ SOLN
25.0000 ug | INTRAMUSCULAR | Status: DC | PRN
  Administered 2014-04-23: 50 ug via INTRAVENOUS

## 2014-04-23 MED ORDER — MIDAZOLAM HCL 2 MG/2ML IJ SOLN
INTRAMUSCULAR | Status: AC
  Filled 2014-04-23: qty 2

## 2014-04-23 MED ORDER — SODIUM CHLORIDE 0.9 % IR SOLN
Status: DC | PRN
  Administered 2014-04-23: 6000 mL

## 2014-04-23 MED ORDER — PHENAZOPYRIDINE HCL 200 MG PO TABS
200.0000 mg | ORAL_TABLET | Freq: Once | ORAL | Status: AC
  Administered 2014-04-23: 200 mg via ORAL
  Filled 2014-04-23: qty 1

## 2014-04-23 MED ORDER — LIDOCAINE HCL 1 % IJ SOLN
INTRAMUSCULAR | Status: AC
  Filled 2014-04-23: qty 20

## 2014-04-23 MED ORDER — MIDAZOLAM HCL 5 MG/5ML IJ SOLN
INTRAMUSCULAR | Status: DC | PRN
  Administered 2014-04-23: 2 mg via INTRAVENOUS

## 2014-04-23 MED ORDER — HYDROCODONE-ACETAMINOPHEN 5-325 MG PO TABS
1.0000 | ORAL_TABLET | Freq: Four times a day (QID) | ORAL | Status: DC | PRN
  Administered 2014-04-23: 1 via ORAL
  Filled 2014-04-23: qty 1

## 2014-04-23 MED ORDER — CIPROFLOXACIN IN D5W 400 MG/200ML IV SOLN
INTRAVENOUS | Status: AC
  Filled 2014-04-23: qty 200

## 2014-04-23 MED ORDER — CIPROFLOXACIN IN D5W 400 MG/200ML IV SOLN: 400.0000 mg | INTRAVENOUS | Status: DC

## 2014-04-23 MED ORDER — LACTATED RINGERS IV SOLN
INTRAVENOUS | Status: DC | PRN
  Administered 2014-04-23: 07:00:00 via INTRAVENOUS

## 2014-04-23 MED ORDER — CIPROFLOXACIN HCL 500 MG PO TABS: 500.0000 mg | ORAL_TABLET | Freq: Once | ORAL | Status: DC

## 2014-04-23 MED ORDER — PROPOFOL 10 MG/ML IV BOLUS
INTRAVENOUS | Status: DC | PRN
  Administered 2014-04-23: 150 mg via INTRAVENOUS

## 2014-04-23 MED ORDER — PROMETHAZINE HCL 25 MG/ML IJ SOLN
6.2500 mg | INTRAMUSCULAR | Status: DC | PRN
  Administered 2014-04-23: 6.25 mg via INTRAVENOUS
  Filled 2014-04-23: qty 1

## 2014-04-23 MED ORDER — BELLADONNA ALKALOIDS-OPIUM 16.2-60 MG RE SUPP
RECTAL | Status: AC
  Filled 2014-04-23: qty 1

## 2014-04-23 MED ORDER — CIPROFLOXACIN IN D5W 400 MG/200ML IV SOLN
400.0000 mg | INTRAVENOUS | Status: AC
  Administered 2014-04-23: 400 mg via INTRAVENOUS

## 2014-04-23 MED ORDER — ONDANSETRON HCL 4 MG/2ML IJ SOLN
INTRAMUSCULAR | Status: DC | PRN
  Administered 2014-04-23: 4 mg via INTRAVENOUS

## 2014-04-23 MED ORDER — TROSPIUM CHLORIDE ER 60 MG PO CP24: 60.0000 mg | ORAL_CAPSULE | Freq: Every day | ORAL | Status: DC

## 2014-04-23 SURGICAL SUPPLY — 19 items
BAG URO CATCHER STRL LF (DRAPE) ×3 IMPLANT
BASKET LASER NITINOL 1.9FR (BASKET) IMPLANT
BASKET ZERO TIP NITINOL 2.4FR (BASKET) ×3 IMPLANT
CATH URET 5FR 28IN OPEN ENDED (CATHETERS) ×3 IMPLANT
CLOTH BEACON ORANGE TIMEOUT ST (SAFETY) ×3 IMPLANT
DRAPE CAMERA CLOSED 9X96 (DRAPES) ×3 IMPLANT
EXTRACTOR STONE NITINOL NGAGE (UROLOGICAL SUPPLIES) ×6 IMPLANT
FIBER LASER FLEXIVA 200 (UROLOGICAL SUPPLIES) ×3 IMPLANT
FIBER LASER TRAC TIP (UROLOGICAL SUPPLIES) ×3 IMPLANT
GLOVE BIOGEL M STRL SZ7.5 (GLOVE) ×9 IMPLANT
GOWN STRL REUS W/TWL XL LVL3 (GOWN DISPOSABLE) ×6 IMPLANT
GUIDEWIRE ANG ZIPWIRE 038X150 (WIRE) IMPLANT
GUIDEWIRE STR DUAL SENSOR (WIRE) ×6 IMPLANT
PACK CYSTO (CUSTOM PROCEDURE TRAY) ×3 IMPLANT
SHEATH ACCESS URETERAL 38CM (SHEATH) ×3 IMPLANT
STENT CONTOUR 6FRX24X.038 (STENTS) ×3 IMPLANT
TUBE FEEDING 8FR 16IN STR KANG (MISCELLANEOUS) IMPLANT
TUBING CONNECTING 10 (TUBING) ×2 IMPLANT
TUBING CONNECTING 10' (TUBING) ×1

## 2014-04-23 NOTE — Transfer of Care (Signed)
Immediate Anesthesia Transfer of Care Note  Patient: Kelli Harmon  Procedure(s) Performed: Procedure(s): LEFT URETEROSCOPY, LASER LITHOTRIPSY, STONE REMOVAL AND STENT PLACEMENT (Left) HOLMIUM LASER APPLICATION (Left)  Patient Location: PACU  Anesthesia Type:General  Level of Consciousness: awake and sedated  Airway & Oxygen Therapy: Patient Spontanous Breathing and Patient connected to face mask oxygen  Post-op Assessment: Report given to PACU RN and Post -op Vital signs reviewed and stable  Post vital signs: Reviewed and stable  Complications: No apparent anesthesia complications

## 2014-04-23 NOTE — Telephone Encounter (Signed)
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  Normal pap last year.  In [facility], pt having kidney stone issues and having lithotripsy today. Letter written today. Will sign and then out of recall.  Encounter closed.  Cc:  Billie RuddySally yeakley

## 2014-04-23 NOTE — Discharge Instructions (Signed)
DISCHARGE INSTRUCTIONS FOR KIDNEY STONE/URETERAL STENT   MEDICATIONS:  1.  Resume all your other meds from home - except do not take any extra narcotic pain meds that you may have at home.  2. Trospium is to prevent bladder spasms and help reduce urinary frequency. 3. Pyridium is to help with the burning/stinging when you urinate. 4. Hydrocodone with acetaminophen is for moderate/severe pain, otherwise taking upto 1000mg  every 6 hours of plainTylenol will help treat your pain.  Do not take both at the same time. 5. Take Cipro one hour prior to removal of your stent.   ACTIVITY:  1. No strenuous activity x 1week  2. No driving while on narcotic pain medications  3. Drink plenty of water  4. Continue to walk at home - you can still get blood clots when you are at home, so keep active, but don't over do it.  5. May return to work/school tomorrow or when you feel ready   BATHING:  1. You can shower and we recommend daily showers  2. You have a string coming from your urethra: The stent string is attached to your ureteral stent. Do not pull on this.   SIGNS/SYMPTOMS TO CALL:  Please call us if you have a fever greater than 101.5, uncontrolled nausea/vomiting, uncontrolled pain, dizziness, unable to urinate, bloody urine, chest pain, shortness of breath, leg swelling, leg pain, redness around wound, drainage from wound, or any other concerns or questions.   You can reach us at [phone].   FOLLOW-UP:  1. You have an appointment in 6 weeks with a ultrasound of your kidneys prior.   2. You have a string attached to your stent, you may remove it on Wednesday, November 4. To do this, pull the strings until the stents are completely removed. You may feel an odd sensation in your back.       General Anesthesia, Care After Refer to this sheet in the next few weeks. These instructions provide you with information on caring for yourself after your procedure. Your health care provider may also  give you more specific instructions. Your treatment has been planned according to current medical practices, but problems sometimes occur. Call your health care provider if you have any problems or questions after your procedure. WHAT TO EXPECT AFTER THE PROCEDURE After the procedure, it is typical to experience:  Sleepiness.  Nausea and vomiting. HOME CARE INSTRUCTIONS  For the first 24 hours after general anesthesia:  Have a responsible person with you.  Do not drive a car. If you are alone, do not take public transportation.  Do not drink alcohol.  Do not take medicine that has not been prescribed by your health care provider.  Do not sign important papers or make important decisions.  You may resume a normal diet and activities as directed by your health care provider.  Change bandages (dressings) as directed.  If you have questions or problems that seem related to general anesthesia, call the hospital and ask for the anesthetist or anesthesiologist on call. SEEK MEDICAL CARE IF:  You have nausea and vomiting that continue the day after anesthesia.  You develop a rash. SEEK IMMEDIATE MEDICAL CARE IF:   You have difficulty breathing.  You have chest pain.  You have any allergic problems. Document Released: 09/17/2000 Document Revised: 06/16/2013 Document Reviewed: 12/25/2012 [facility] Patient Information 2015 [employer]. This information is not intended to replace advice given to you by your health care provider. Make sure you discuss any  questions you have with your health care provider. ° °

## 2014-04-23 NOTE — Anesthesia Postprocedure Evaluation (Signed)
  Anesthesia Post-op Note  Patient: Kelli ArgueJill D Bourdeau  Procedure(s) Performed: Procedure(s) (LRB): LEFT URETEROSCOPY, LASER LITHOTRIPSY, STONE REMOVAL AND STENT PLACEMENT (Left) HOLMIUM LASER APPLICATION (Left)  Patient Location: PACU  Anesthesia Type: General  Level of Consciousness: awake and alert   Airway and Oxygen Therapy: Patient Spontanous Breathing  Post-op Pain: mild  Post-op Assessment: Post-op Vital signs reviewed, Patient's Cardiovascular Status Stable, Respiratory Function Stable, Patent Airway and No signs of Nausea or vomiting  Last Vitals:  Filed Vitals:   04/23/14 1238  BP: 138/88  Pulse: 88  Temp: 36.6 C  Resp: 16    Post-op Vital Signs: stable   Complications: No apparent anesthesia complications

## 2014-04-23 NOTE — Op Note (Signed)
Preoperative diagnosis:  1. Left lower pole stone  Postoperative diagnosis:  1. same   Procedure: 1. Cystoscopy, retrograde pyelogram with interpretation 2. Left ureteroscopy with laser lithotripsy 3. Left ureteral stent placement  Surgeon: Crist FatBenjamin W. Kingslee Dowse, MD  Anesthesia: General  Complications: None  Intraoperative findings: Retrograde pyelogram demonstrated a normal left ureter with no filling defects. The renal pelvis was nondilated and there was a filling defect in the lower pole consistent with the patient's stone calcification. U ureteroscopically the stone was partially 10 mm in the lower pole. It was fragmented into multiple pieces and all the large fragments were removed.  EBL: Minimal  Specimens: Stone fragments were given to the patient.  Indication: Kelli ArgueJill D Harmon is a 43 y.o. patient with history of nephrolithiasis. Initially she underwent a right mid ureteral stone extraction and stent placement. Additionally she had a known stone in the left lower pole which she opted to electively removed prior to the additional morbidity associated with an obstructing stone.  After reviewing the management options for treatment, he elected to proceed with the above surgical procedure(s). We have discussed the potential benefits and risks of the procedure, side effects of the proposed treatment, the likelihood of the patient achieving the goals of the procedure, and any potential problems that might occur during the procedure or recuperation. Informed consent has been obtained.  Description of procedure:  The patient was taken to the operating room and general anesthesia was induced.  The patient was placed in the dorsal lithotomy position, prepped and draped in the usual sterile fashion, and preoperative antibiotics were administered. A preoperative time-out was performed.   22 French 30 cystoscope was then gently passed through the patient's urethra and into the bladder. There are  60 cystoscopic evaluation was then performed with no significant findings. There was some squamous metaplasia at the trigone, the ureters were orthotopic, and there was no additional pathology within the bladder mucosa. A 5 French open-ended ureteral catheter was then jelly passed up the patient's right ureteral orifice and a retrograde pyelogram was performed using Omnipaque contrast. The above findings were then noted. A 0.38 sensor wire was then gently passed through the open-ended catheter and into the left renal pelvis. The catheter was then removed over the wire and the cystoscope was then gently backed out the urethra. A 6 French semirigid ureteroscope was then gently passed through the patient's urethra and into the bladder. I gently was then able to get the scope into the left ureteral orifice and up into the proximal ureter. There was no ureteral pathology or stones within the ureter. I then passed a second 0.38 sensor wire through the ureteroscope and gently tacked ureteroscope out under visual guidance. Next I passed a 12/14 French 39 cm ureteral access sheath over the second wire and gently beyond the UVJ and into the left proximal ureter. I took out the inner sheath as well as the wire. I then passed a digital flexible ureteroscope through the access sheath and into the renal pelvis. Pyeloscopy was performed and there was only one stone encountered in the lower pole. There was additional small fragment that was separate from the large stone. I then attempted to move the stone to the upper pole but I was unsuccessful in grabbing the stone with the N gauge basket. In addition I tried using the 0 tip basket unsuccessfully. At this point, I opted to fragment the stone where it was in the lower pole using a 200  laser fiber.  The settings were 0.6 J and 6 Hz. I was able to fragment the stone into multiple smaller pieces that I was able then able to remove using the engage basket. Once all the fragments  had been removed I again performed pyeloscopy and retrieved any additional small fragments. I again double checked to ensure that there were no additional large fragments within the renal pelvis and then gently backed out the flexible ureteroscope removing the sheath under visual guidance. Again, the ureter appeared unmolested and without any significant damage from the access sheath. I then opted to place a 24 cm 6 French double-J ureteral stent. I gently passed the stent over the wire and advanced it up into the renal pelvis under visual guidance. Applying pressure at the distal end of the stent against the pubic bone I removed the wire and the distal end of the stent snapped into the bladder with a nice curl. I confirmed a curl also in the renal pelvis using fluoroscopy. I then tucked the string into the vagina. A B and O suppository was then passed into the patient's rectum and 1% lidocaine jelly injected into the patient's urethra. The patient was subsequently extubated and returned to PACU in a condition.  Disposition: The patient has been instructed to remove the stent in 5 days. I will see her in clinic in 6 weeks for renal ultrasound.   Crist FatBenjamin W. Loman Logan, M.D.

## 2014-04-23 NOTE — H&P (Signed)
History of Present Illness Ms Kelli Harmon had right ureteral stone extraction on 6/26. She is doing well. The composition of the stone is calcium oxalate and carbonate apatite. She has occasional sharp right flank pain that lasts only a few seconds. She is known to have a 9 mm stone in the lower pole of the left kidney. She does not have any pain. Urinalysis shows 3-6 WBC's, 3-6 RBC's, few bacteria. She is not interested in ESL. Will ask Dr Marlou PorchHerrick to review the CT scan and see her in consultation for ureteroscopy or PCNL.   Past Medical History Problems  1. History of Anxiety (300.00) 2. History of asthma (V12.69) 3. History of Seizures (780.39)  Surgical History Problems  1. History of Breast Surgery 2. History of Cystoscopy With Insertion Of Ureteral Stent Right 3. History of Cystoscopy With Ureteroscopy With Lithotripsy 4. History of Tonsillectomy  Current Meds 1. APAP-Isometheptene-Dichloral CAPS;  Therapy: (Recorded:25Jun2015) to Recorded 2. Hydrocodone-Acetaminophen 5-325 MG Oral Tablet; 1 or 2 every 4 to 6 hours prn pain;  Therapy: 09Jul2015 to (Last Rx:09Jul2015) Ordered 3. Oxycodone-Acetaminophen 5-325 MG Oral Tablet; TAKE 1 TO 2 TABLETS EVERY 4  HOURS AS NEEDED FOR PAIN;  Therapy: 25Jun2015 to (Last Rx:25Jun2015) Ordered 4. Tamsulosin HCl - 0.4 MG Oral Capsule;  Therapy: (Recorded:25Jun2015) to Recorded 5. TraMADol HCl TABS;  Therapy: (Recorded:25Jun2015) to Recorded 6. Wellbutrin TABS (BuPROPion HCl);  Therapy: (Recorded:25Jun2015) to Recorded  Allergies Medication  1. Codeine Derivatives  Family History Problems  1. Family history of kidney stones (V18.69) : Maternal Grandfather 2. Family history of Hematuria, gross : Mother, Grandfather  Social History Problems  1. Alcohol use (V49.89)   occ 2. Caffeine use (V49.89) 3. Never a smoker 4. Single  Review of Systems Genitourinary, constitutional, skin, eye, otolaryngeal, hematologic/lymphatic, cardiovascular,  pulmonary, endocrine, musculoskeletal, gastrointestinal, neurological and psychiatric system(s) were reviewed and pertinent findings if present are noted.    Vitals Vital Signs [Data Includes: Last 1 Day]  Recorded: 20Aug2015 04:05PM  Blood Pressure: 158 / 101 Temperature: 98 F Heart Rate: 80 Respiration: 18  Results/Data Urine [Data Includes: Last 1 Day]   20Aug2015  COLOR YELLOW   APPEARANCE CLEAR   SPECIFIC GRAVITY 1.020   pH 6.0   GLUCOSE NEG mg/dL  BILIRUBIN NEG   KETONE NEG mg/dL  BLOOD TRACE   PROTEIN NEG mg/dL  UROBILINOGEN 0.2 mg/dL  NITRITE NEG   LEUKOCYTE ESTERASE NEG   SQUAMOUS EPITHELIAL/HPF FEW   WBC 3-6 WBC/hpf  RBC 3-6 RBC/hpf  BACTERIA FEW   CRYSTALS Calcium Oxalate crystals noted   CASTS NONE SEEN   Other MUCUS    Assessment Assessed  1. Calculus of left kidney (592.0) 2. Urinary tract infection (599.0)  Plan Calculus of right ureter  1. Litholink Stone Risk-Urine; Status:Hold For - [employer]Specimen/Data Collection; Requested  for:20Aug2015;  Health Maintenance  2. UA With REFLEX; [Do Not Release]; Status:Complete;   Done: 20Aug2015 03:43PM   I have discussed the management options with the patient in regards to her left kidney stone.  The patient is anxious to get rid of it.  I have offered ureteroscopy, laser lithotripsy and stent placement as an option.  We discussed the risk and benefits as well as the possibility of needing an additional procedure in order to get all her stones.  Have heard and understood all the issues, she has agreed to proceed.

## 2014-04-26 ENCOUNTER — Encounter (HOSPITAL_COMMUNITY): Payer: Self-pay | Admitting: Urology

## 2014-04-26 ENCOUNTER — Emergency Department (HOSPITAL_COMMUNITY)
Admission: EM | Admit: 2014-04-26 | Discharge: 2014-04-27 | Disposition: A | Discharge status: Home / Self Care | Payer: BC Managed Care – PPO | Attending: Emergency Medicine | Admitting: Emergency Medicine

## 2014-04-26 DIAGNOSIS — Z8701 Personal history of pneumonia (recurrent): Secondary | ICD-10-CM | POA: Diagnosis not present

## 2014-04-26 DIAGNOSIS — Z8719 Personal history of other diseases of the digestive system: Secondary | ICD-10-CM | POA: Insufficient documentation

## 2014-04-26 DIAGNOSIS — F419 Anxiety disorder, unspecified: Secondary | ICD-10-CM | POA: Insufficient documentation

## 2014-04-26 DIAGNOSIS — Z8639 Personal history of other endocrine, nutritional and metabolic disease: Secondary | ICD-10-CM | POA: Insufficient documentation

## 2014-04-26 DIAGNOSIS — Z87448 Personal history of other diseases of urinary system: Secondary | ICD-10-CM | POA: Insufficient documentation

## 2014-04-26 DIAGNOSIS — Z792 Long term (current) use of antibiotics: Secondary | ICD-10-CM | POA: Insufficient documentation

## 2014-04-26 DIAGNOSIS — Z79899 Other long term (current) drug therapy: Secondary | ICD-10-CM | POA: Diagnosis not present

## 2014-04-26 DIAGNOSIS — Z791 Long term (current) use of non-steroidal anti-inflammatories (NSAID): Secondary | ICD-10-CM | POA: Diagnosis not present

## 2014-04-26 DIAGNOSIS — J45909 Unspecified asthma, uncomplicated: Secondary | ICD-10-CM | POA: Diagnosis not present

## 2014-04-26 DIAGNOSIS — R109 Unspecified abdominal pain: Secondary | ICD-10-CM | POA: Diagnosis present

## 2014-04-26 DIAGNOSIS — Z87442 Personal history of urinary calculi: Secondary | ICD-10-CM | POA: Insufficient documentation

## 2014-04-26 NOTE — Discharge Instructions (Signed)
Return here as needed. Follow up with your Urologist °

## 2014-04-26 NOTE — Telephone Encounter (Signed)
Letter sent today. Recall completed. Encounter closed.   CC: Kelli Harmon

## 2014-04-26 NOTE — ED Notes (Signed)
Pt had a kidney stone removed Friday and a stent placed, she took the stent out today because she was in pain, it was not suppose to come out until Wednesday, she also states that she is bleeding clots.

## 2014-04-27 NOTE — ED Notes (Signed)
Patient left prior to receiving d/c instructions. Pt told that RN was awaiting d/c instructions from Md but chose to leave.

## 2014-05-01 NOTE — ED Provider Notes (Signed)
CSN: [id number]     Arrival date & time 04/26/14  2120 History   First MD Initiated Contact with Patient 04/26/14 2233     Chief Complaint  Patient presents with  . Flank Pain     (Consider location/radiation/quality/duration/timing/severity/associated sxs/prior Treatment) HPI Patient presents to the emergency department with left flank pain started getting worse earlier today.  The patient states that she removed her ureteral stent thinking this is causing worsening problems the patient states the pain has now subsided patient denies chest pain shortness of breath nausea vomiting weakness dizziness headache blurred vision back pain neck pain fever or syncope the patient did not take any medications other than the hydrocodone prior to arrival.patient states that the pain initially got worse but then completely resolved  Past Medical History  Diagnosis Date  . Anxiety   . Right ureteral stone   . Left nephrolithiasis   . Asthma, exercise induced   . History of hypothyroidism     early age 43's -- no issues since  . GERD (gastroesophageal reflux disease)   . Urgency of urination   . Hematuria   . Wears contact lenses   . Headache(784.0)     MIGRAINES  . Environmental allergies   . Shortness of breath     exercise only   . Pneumonia     03/16/2014   Past Surgical History  Procedure Laterality Date  . Cervical biopsy  w/ loop electrode excision  2000    Normal  . Tonsillectomy  1983  . Breast reduction surgery Bilateral 09-14-2003  . Cystoscopy with retrograde pyelogram, ureteroscopy and stent placement Right 12/18/2013    Procedure: CYSTOSCOPY WITH RETROGRADE PYELOGRAM, URETEROSCOPY AND STENT PLACEMENT;  Surgeon: Danae ChenMarc H Nesi, MD;  [facility] [place];  Service: Urology;  Laterality: Right;  . Holmium laser application Right 12/18/2013    Procedure: HOLMIUM LASER APPLICATION;  Surgeon: Danae ChenMarc H Nesi, MD;  [facility] [place];  Service: Urology;   Laterality: Right;  . Cystoscopy with ureteroscopy and stent placement Left 04/23/2014    Procedure: LEFT URETEROSCOPY, LASER LITHOTRIPSY, STONE REMOVAL AND STENT PLACEMENT;  Surgeon: Crist FatBenjamin W Herrick, MD;  Location: [facility];  Service: Urology;  Laterality: Left;  . Holmium laser application Left 04/23/2014    Procedure: HOLMIUM LASER APPLICATION;  Surgeon: Crist FatBenjamin W Herrick, MD;  Location: [facility];  Service: Urology;  Laterality: Left;   History reviewed. No pertinent family history. History  Substance Use Topics  . Smoking status: Never Smoker   . Smokeless tobacco: Never Used  . Alcohol Use: Yes     Comment: occasional   OB History    Gravida Para Term Preterm AB TAB SAB Ectopic Multiple Living   0 0 0 0 0 0 0 0 0 0      Review of Systems All other systems negative except as documented in the HPI. All pertinent positives and negatives as reviewed in the HPI.   Allergies  Codeine  Home Medications   Prior to Admission medications   Medication Sig Start Date End Date Taking? Authorizing Provider  albuterol (PROVENTIL HFA;VENTOLIN HFA) 108 (90 BASE) MCG/ACT inhaler Inhale 2 puffs into the lungs every 6 (six) hours as needed for wheezing or shortness of breath.   Yes Historical Provider, MD  HYDROcodone-acetaminophen (NORCO/VICODIN) 5-325 MG per tablet Take 1-2 tablets by mouth every 6 (six) hours as needed. Patient taking differently: Take 1-2 tablets by mouth every 6 (six) hours as needed (for pain.).  04/23/14  Yes Crist FatBenjamin W Herrick, MD  Hyprom-Naphaz-Polysorb-Zn Sulf (CLEAR EYES COMPLETE OP) Apply 1 drop to eye daily as needed (dry eyes.).    Yes Historical Provider, MD  Ibuprofen (MIDOL) 200 MG CAPS Take 1-2 capsules by mouth 3 (three) times daily as needed (cramps).    Yes Historical Provider, MD  isometheptene-acetaminophen-dichloralphenazone (MIDRIN) 65-325-100 MG capsule Take 1 capsule by mouth once as needed for migraine. Maximum 5 capsules in 12 hours for migraine  headaches, 8 capsules in 24 hours for tension headaches.   Yes Historical Provider, MD  MELATONIN PO Take 1 tablet by mouth at bedtime as needed (sleep).    Yes Historical Provider, MD  Multiple Vitamins-Minerals (MULTIVITAMIN PO) Take 1 tablet by mouth daily.    Yes Historical Provider, MD  naproxen sodium (ALEVE) 220 MG tablet Take 220 mg by mouth daily as needed (headache).   Yes Historical Provider, MD  phenazopyridine (PYRIDIUM) 200 MG tablet Take 1 tablet (200 mg total) by mouth 3 (three) times daily as needed for pain. 04/23/14  Yes Crist FatBenjamin W Herrick, MD  tamsulosin (FLOMAX) 0.4 MG CAPS capsule Take 0.4 mg by mouth daily. 04/10/14  Yes Historical Provider, MD  traMADol (ULTRAM) 50 MG tablet Take 50 mg by mouth every 6 (six) hours as needed for moderate pain.    Yes Historical Provider, MD  Trospium Chloride 60 MG CP24 Take 1 capsule (60 mg total) by mouth daily. 04/23/14  Yes Crist FatBenjamin W Herrick, MD  azithromycin (ZITHROMAX) 250 MG tablet Take 1 tablet (250 mg total) by mouth daily. Take first 2 tablets together, then 1 every day until finished. 03/17/14   Shon Batonourtney F Horton, MD  ciprofloxacin (CIPRO) 500 MG tablet Take 1 tablet (500 mg total) by mouth once. 04/23/14   Crist FatBenjamin W Herrick, MD   BP 145/89 mmHg  Pulse 105  Temp(Src) 97.9 F (36.6 C) (Oral)  Resp 22  SpO2 96%  LMP 04/17/2014 Physical Exam  Constitutional: She is oriented to person, place, and time. She appears well-developed and well-nourished. No distress.  HENT:  Head: Normocephalic and atraumatic.  Mouth/Throat: Oropharynx is clear and moist.  Eyes: Pupils are equal, round, and reactive to light.  Neck: Normal range of motion. Neck supple.  Cardiovascular: Normal rate, regular rhythm and normal heart sounds.  Exam reveals no gallop and no friction rub.   No murmur heard. Pulmonary/Chest: Effort normal and breath sounds normal. No respiratory distress.  Musculoskeletal: She exhibits no edema.  Neurological: She is  alert and oriented to person, place, and time. She exhibits normal muscle tone. Coordination normal.  Skin: Skin is warm and dry.  Nursing note and vitals reviewed.   ED Course  Procedures (including critical care time) Patient is referred back to her urologist.  The patient is advised to return here as needed.  She is advised to increase her fluid intake    Carlyle DollyChristopher W Josip Merolla, PA-C 05/01/14 0025  Linwood DibblesJon Knapp, MD 05/02/14 [phone]

## 2015-11-27 IMAGING — CT CT ABD-PELV W/O CM
3 of 4 series · 7 of 46 positions shown, 13 images · non-contrast
Comparison: None.

CLINICAL DATA: Hematuria, right flank pain

EXAM:
CT ABDOMEN AND PELVIS WITHOUT CONTRAST
TECHNIQUE: Multidetector CT imaging of the abdomen and pelvis was performed
following the standard protocol without IV contrast.

[Series 3: lung windows · axial · 0.70mm/px · z∈[-60,-20]mm · 3 of 16 slices shown, 7 images]
[im 4/16  soft-tissue]
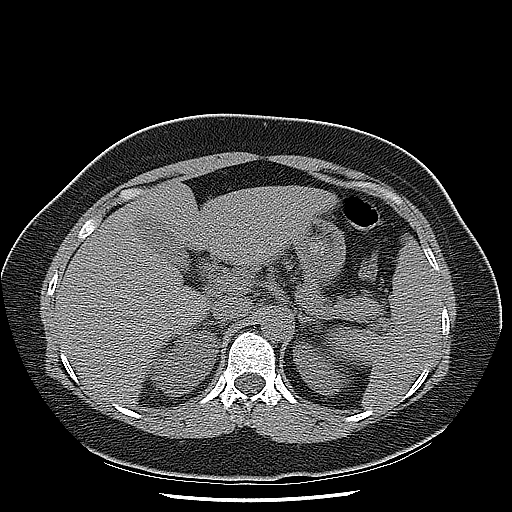
[im 4/16  lung]
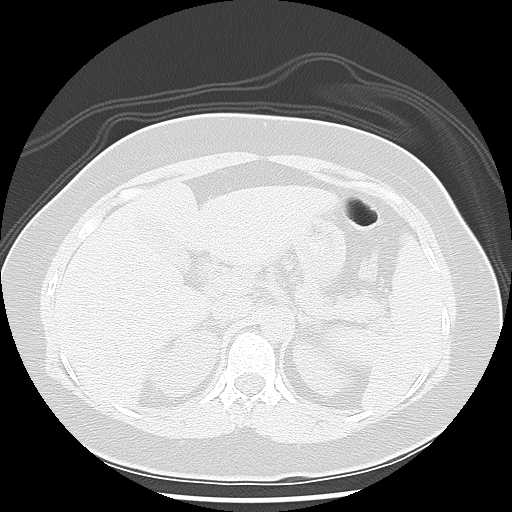
[im 4/16  bone]
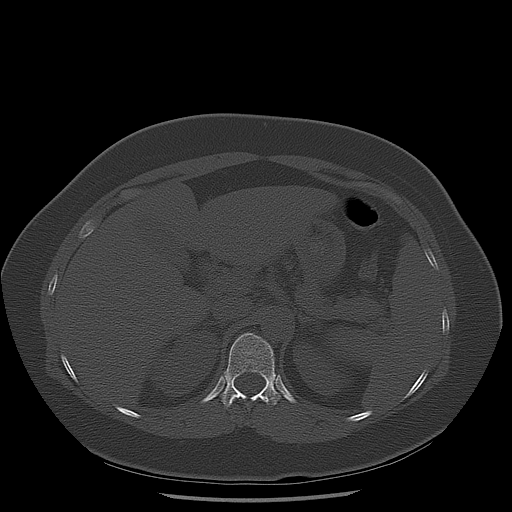
[im 8/16  soft-tissue]
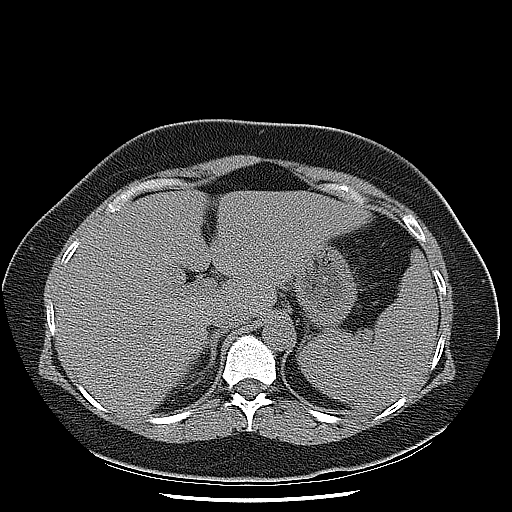
[im 8/16  lung]
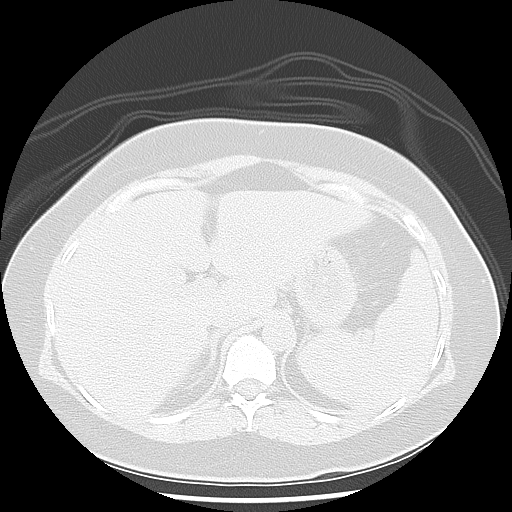
[im 12/16  soft-tissue]
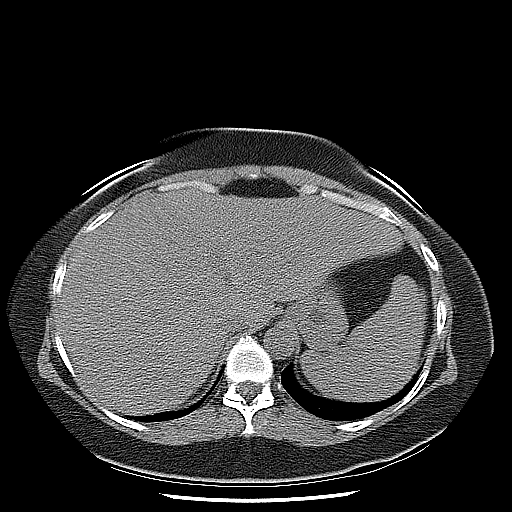
[im 12/16  lung]
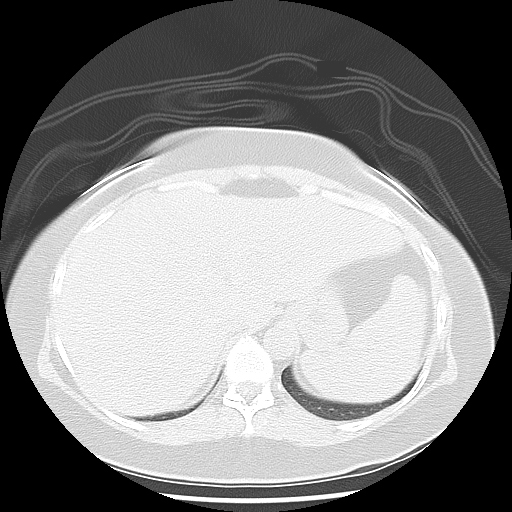

[Series 103: cor · coronal · 1.00mm/px · 3 of 134 slices shown, 4 images]
[im 45/134  soft-tissue]
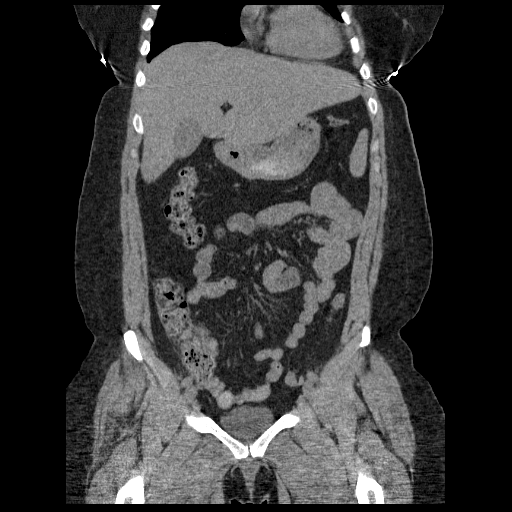
[im 60/134  soft-tissue]
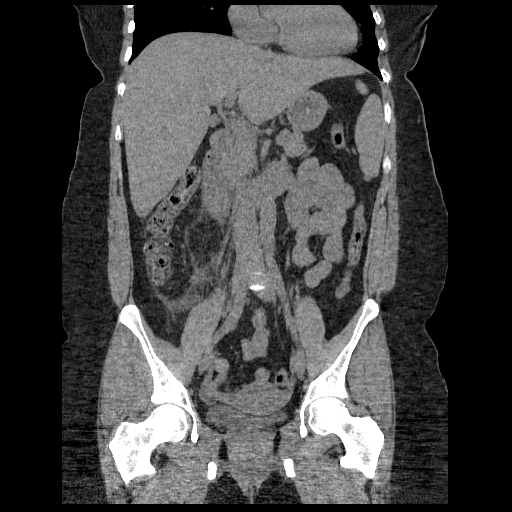
[im 60/134  bone]
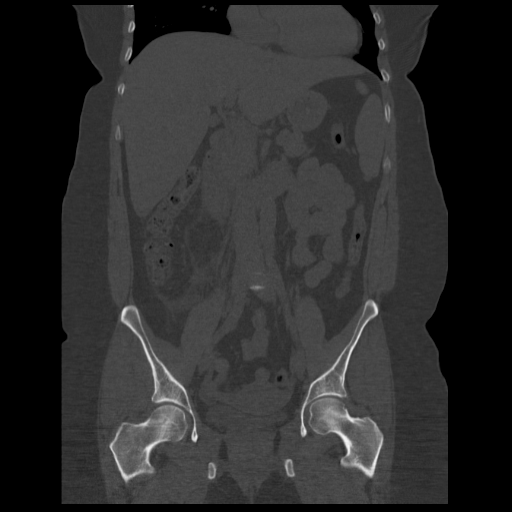
[im 74/134  soft-tissue]
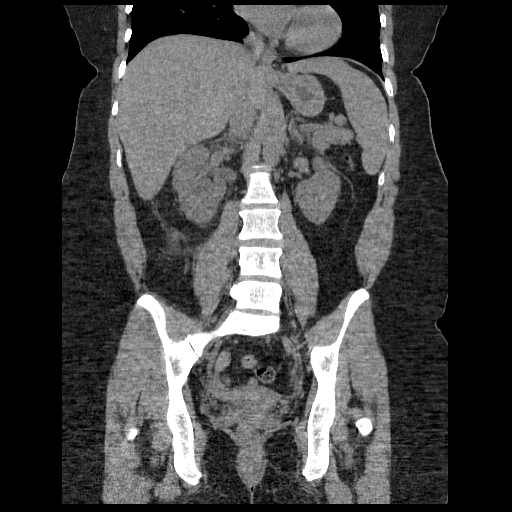

[Series 104: sag · sagittal · 1.00mm/px · 1 of 161 slices shown, 2 images]
[im 54/161  soft-tissue]
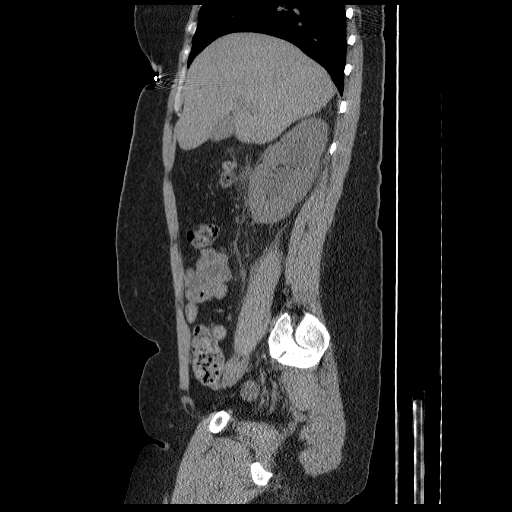
[im 54/161  bone]
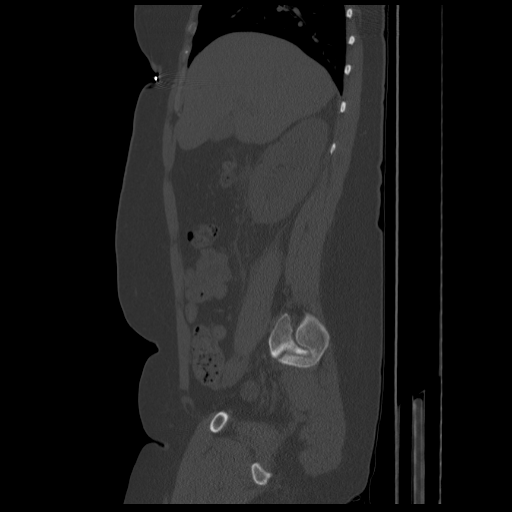

[7 of 46 positions shown; findings below may reference images not displayed]

FINDINGS: Sagittal images of the spine shows mild degenerative changes
thoracic spine. Unenhanced liver shows no biliary ductal dilatation.
Lung bases are unremarkable. No calcified gallstones are noted
within gallbladder. The pancreas, spleen and adrenals are
unremarkable. There is mild right hydronephrosis and proximal right
hydroureter. Mild right perinephric and periureteral stranding.

In axial image 48 there is 7 x 4 mm calcified calculus in mid right
ureter at the level of upper endplate of L4 vertebral body. In
sagittal image 67 the right calculus measures 9 mm cranio caudally.

Nonobstructive calcified calculus in lower pole of the left kidney
measures 9 by 6 mm.

Small amount of fluid/stranding noted anterior to right psoas
muscle. No pericecal inflammation. The terminal ileum is
unremarkable.

Bilateral distal ureter is unremarkable. Urinary bladder is
unremarkable. No adnexal mass. No pelvic ascites or adenopathy. No
inguinal adenopathy. Small nonspecific bilateral inguinal lymph
nodes are noted.
IMPRESSION: 1. There is mild right hydronephrosis and right hydroureter. Mild
right perinephric stranding.
2. There is 7 x 4 mm calcified calculus in mid right ureter. The
calculus is obstructive and on sagittal images measures 9 mm length.
3. There is left nonobstructive nephrolithiasis.
4. No calcified calculi are noted within urinary bladder.

## 2015-12-07 ENCOUNTER — Ambulatory Visit (INDEPENDENT_AMBULATORY_CARE_PROVIDER_SITE_OTHER): Payer: BLUE CROSS/BLUE SHIELD

## 2015-12-07 ENCOUNTER — Ambulatory Visit (INDEPENDENT_AMBULATORY_CARE_PROVIDER_SITE_OTHER): Payer: BLUE CROSS/BLUE SHIELD | Admitting: Orthopedic Surgery

## 2015-12-07 VITALS — BP 151/99 | HR 86 | Ht 64.0 in | Wt 199.0 lb

## 2015-12-07 DIAGNOSIS — M7741 Metatarsalgia, right foot: Secondary | ICD-10-CM | POA: Diagnosis not present

## 2015-12-07 DIAGNOSIS — M79671 Pain in right foot: Secondary | ICD-10-CM

## 2015-12-07 DIAGNOSIS — M2041 Other hammer toe(s) (acquired), right foot: Secondary | ICD-10-CM

## 2015-12-08 NOTE — Progress Notes (Signed)
Patient ID: Kelli Harmon, female   DOB: 10/29/1970, 45 y.o.   MRN: [id number]  Chief Complaint  Patient presents with  . New Patient (Initial Visit)    Right foot pain, Referred by Dr. Hyman Hopes    HPI Kelli Harmon is a 45 y.o. female.  Patient is for evaluation of ongoing plantar pain over the second metatarsal. Her symptoms started about a month ago when she hit her second toe and noticed pain and swelling around the second MTP joint. Since that time she's had trouble with weightbearing and feels like something is sticking in the bottom of the foot. No malalignment in these coronal plane as been noted. She denies numbness or tingling of the digits of the foot. No prior injuries to date.  Review of Systems Review of Systems  Constitutional: Negative for fever and chills.  Musculoskeletal: Negative for arthralgias.  Neurological: Negative for numbness.    Past Medical History  Diagnosis Date  . Anxiety   . Right ureteral stone   . Left nephrolithiasis   . Asthma, exercise induced   . History of hypothyroidism     early age 65's -- no issues since  . GERD (gastroesophageal reflux disease)   . Urgency of urination   . Hematuria   . Wears contact lenses   . Headache(784.0)     MIGRAINES  . Environmental allergies   . Shortness of breath     exercise only   . Pneumonia     03/16/2014    Past Surgical History  Procedure Laterality Date  . Cervical biopsy  w/ loop electrode excision  2000    Normal  . Tonsillectomy  1983  . Breast reduction surgery Bilateral 09-14-2003  . Cystoscopy with retrograde pyelogram, ureteroscopy and stent placement Right 12/18/2013    Procedure: CYSTOSCOPY WITH RETROGRADE PYELOGRAM, URETEROSCOPY AND STENT PLACEMENT;  Surgeon: Danae Chen, MD;  Location: [facility];  Service: Urology;  Laterality: Right;  . Holmium laser application Right 12/18/2013    Procedure: HOLMIUM LASER APPLICATION;  Surgeon: Danae Chen, MD;  Location: [facility];  Service: Urology;  Laterality: Right;  . Cystoscopy with ureteroscopy and stent placement Left 04/23/2014    Procedure: LEFT URETEROSCOPY, LASER LITHOTRIPSY, STONE REMOVAL AND STENT PLACEMENT;  Surgeon: Crist Fat, MD;  Location: [facility];  Service: Urology;  Laterality: Left;  . Holmium laser application Left 04/23/2014    Procedure: HOLMIUM LASER APPLICATION;  Surgeon: Crist Fat, MD;  Location: [facility];  Service: Urology;  Laterality: Left;    No family history on file.  Social History Social History  Substance Use Topics  . Smoking status: Never Smoker   . Smokeless tobacco: Never Used  . Alcohol Use: Yes     Comment: occasional    Allergies  Allergen Reactions  . Codeine Itching and Anxiety    Current Outpatient Prescriptions  Medication Sig Dispense Refill  . hydrochlorothiazide (HYDRODIURIL) 25 MG tablet   0  . isometheptene-acetaminophen-dichloralphenazone (MIDRIN) 65-100-325 MG capsule take 1 capsule by mouth every 4 hours if needed  0  . MELATONIN PO Take 1 tablet by mouth at bedtime as needed (sleep).     . Multiple Vitamins-Minerals (MULTIVITAMIN PO) Take 1 tablet by mouth daily.     . naproxen sodium (ALEVE) 220 MG tablet Take 220 mg by mouth daily as needed (headache).    . sertraline (ZOLOFT) 50 MG tablet   0   No current facility-administered medications  for this visit.       Physical Exam  BP 151/99 mmHg  Pulse 86  Ht 5\' 4"  (1.626 m)  Wt 199 lb (90.266 kg)  BMI 34.14 kg/m2  LMP 11/16/2015 (Approximate)  Physical Exam  Constitutional: She is oriented to person, place, and time. She appears well-developed and well-nourished. No distress.  Cardiovascular: Normal rate and intact distal pulses.   Neurological: She is alert and oriented to person, place, and time. She has normal reflexes. She exhibits normal muscle tone. Coordination normal.  Skin: Skin is warm and dry. No rash noted. She is not diaphoretic. No erythema. No  pallor.  Psychiatric: She has a normal mood and affect. Her behavior is normal. Judgment and thought content normal.    Ortho Exam  Gait: Normal ambulatory pattern.  When she is in the standing position both feet are examined. Left foot shows no abnormality in alignment of the toes she is nontender neurovascular exam is intact.  Right foot shows slight flexion at the PIP joint with tenderness in the plantar aspect of the MTP joint tenderness around the MTP joint no dislocation subluxation or laxity noted. Remaining foot shows alignment normal. Passive range of motion is painful with flexion of the PIP joint. No atrophy seen in the foot.   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  Fuller CanadaStanley Maguire Sime, MD 12/08/2015 8:45 AM

## 2016-01-11 ENCOUNTER — Ambulatory Visit (INDEPENDENT_AMBULATORY_CARE_PROVIDER_SITE_OTHER): Payer: BLUE CROSS/BLUE SHIELD | Admitting: Orthopedic Surgery

## 2016-01-11 VITALS — BP 157/104 | HR 108 | Ht 64.0 in | Wt 198.6 lb

## 2016-01-11 DIAGNOSIS — M2041 Other hammer toe(s) (acquired), right foot: Secondary | ICD-10-CM

## 2016-01-11 NOTE — Progress Notes (Signed)
Patient ID: Lilia ArgueJill D Elmendorf, female   DOB: 1971-05-04, 45 y.o.   MRN: [id number]  Chief Complaint  Patient presents with  . Follow-up    Right foot    HPI 45 year old female pain right foot second metatarsal head plantar pain hammertoe deformity did not respond metatarsal pads  ROS  Denies numbness or tingling  BP 157/104 mmHg  Pulse 108  Ht 5\' 4"  (1.626 m)  Wt 198 lb 9.6 oz (90.084 kg)  BMI 34.07 kg/m2 Gen. appearance is normal grooming and hygiene Orientation to person place and time normal Mood normal  No peripheral edema or swelling is noted in the right foot Sensory exam shows normal sensation to palpation, pressure and soft touch Skin exam no lacerations ulcerations or erythema  Ortho Exam  Flexible hammertoe tenderness plantar aspect of the second metatarsal head   A/P  Medical decision-making  Recommend referral to podiatrist Dr. Presley RaddleMcKinney  Avondre Richens, MD 01/11/2016 5:35 PM

## 2016-03-30 IMAGING — CR DG CHEST 2V
2 series · 2 of 2 positions shown · non-contrast
Comparison: None.

CLINICAL DATA: Preoperative evaluation for kidney stone removal,
history of asthma

EXAM:
CHEST  2 VIEW

[w chest pa]
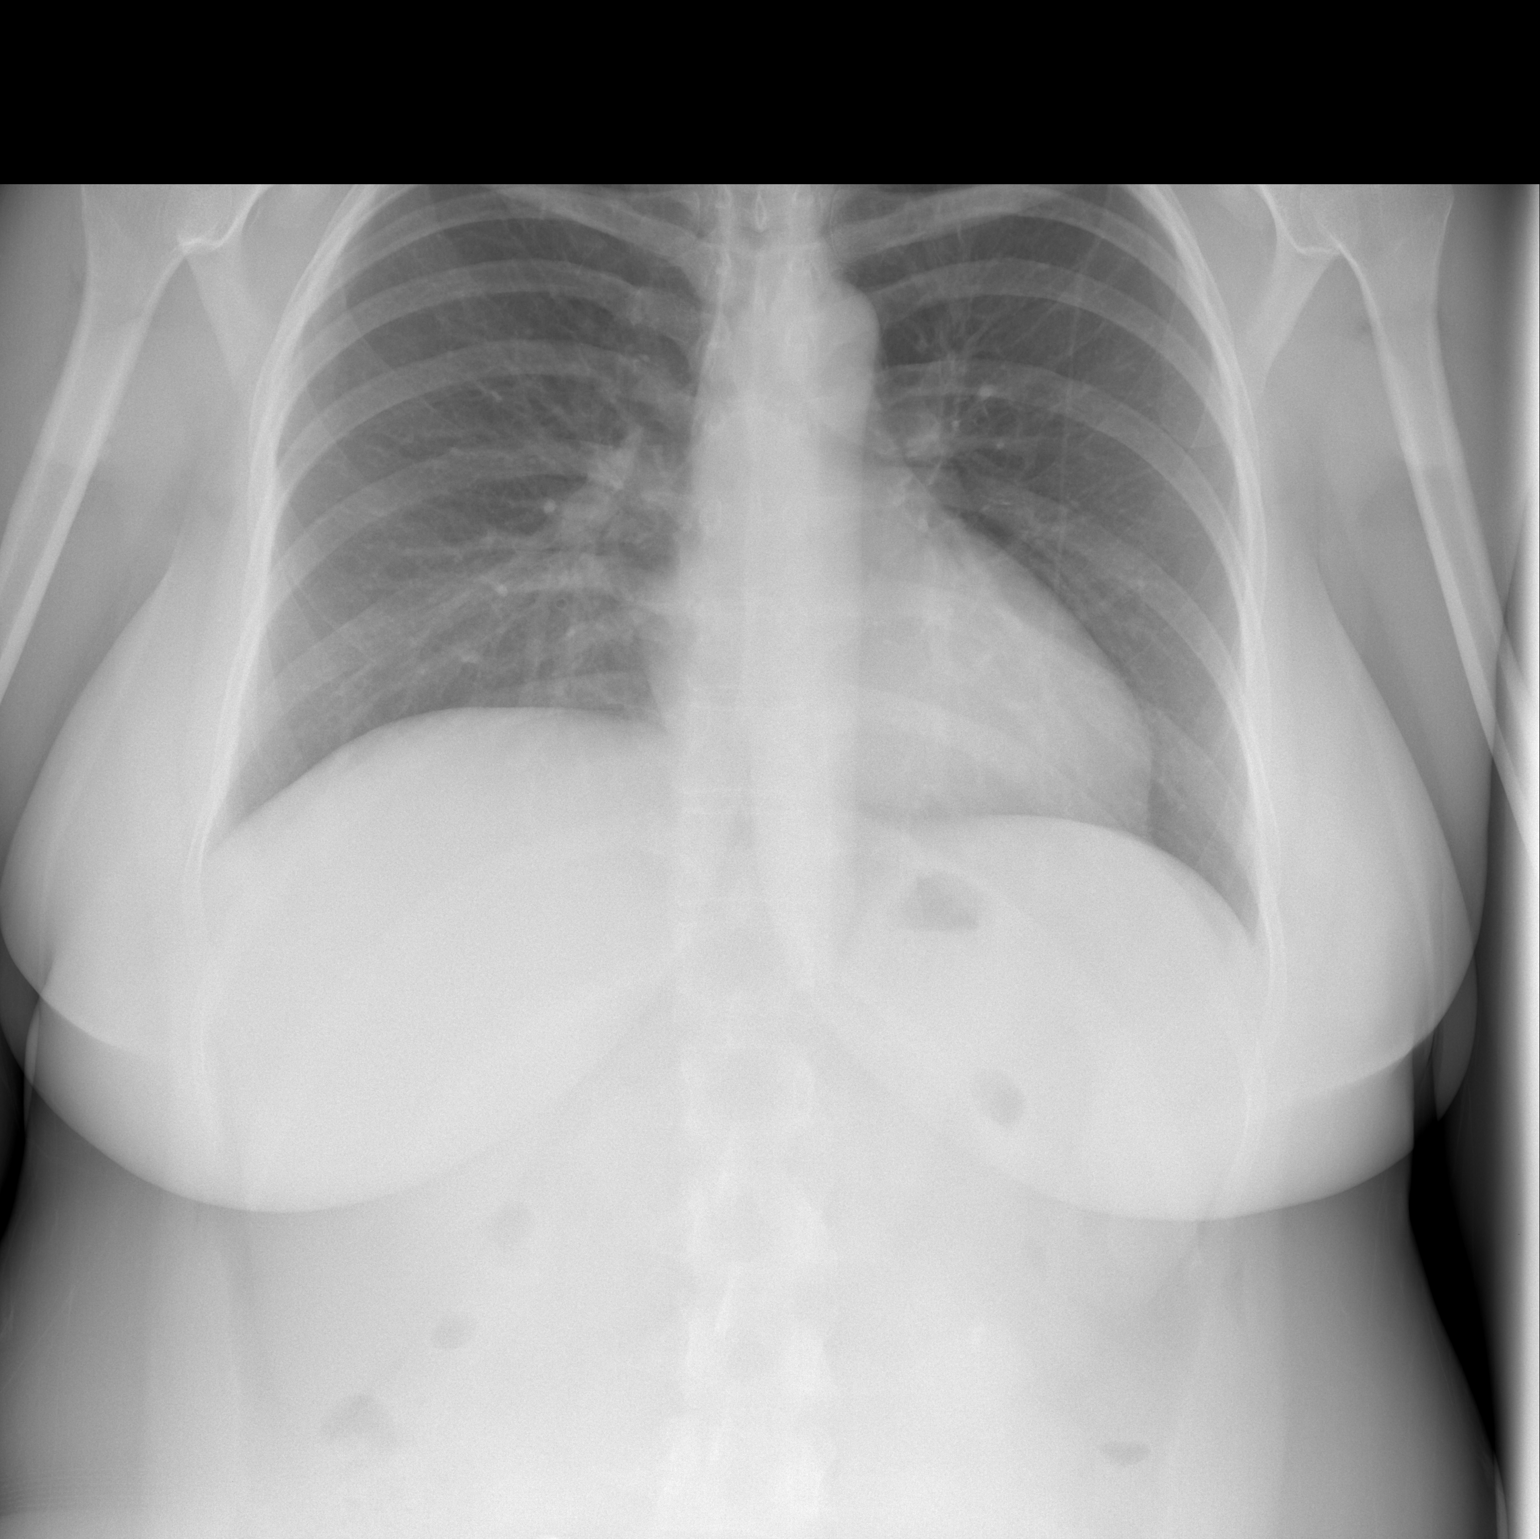

[w chest lat]
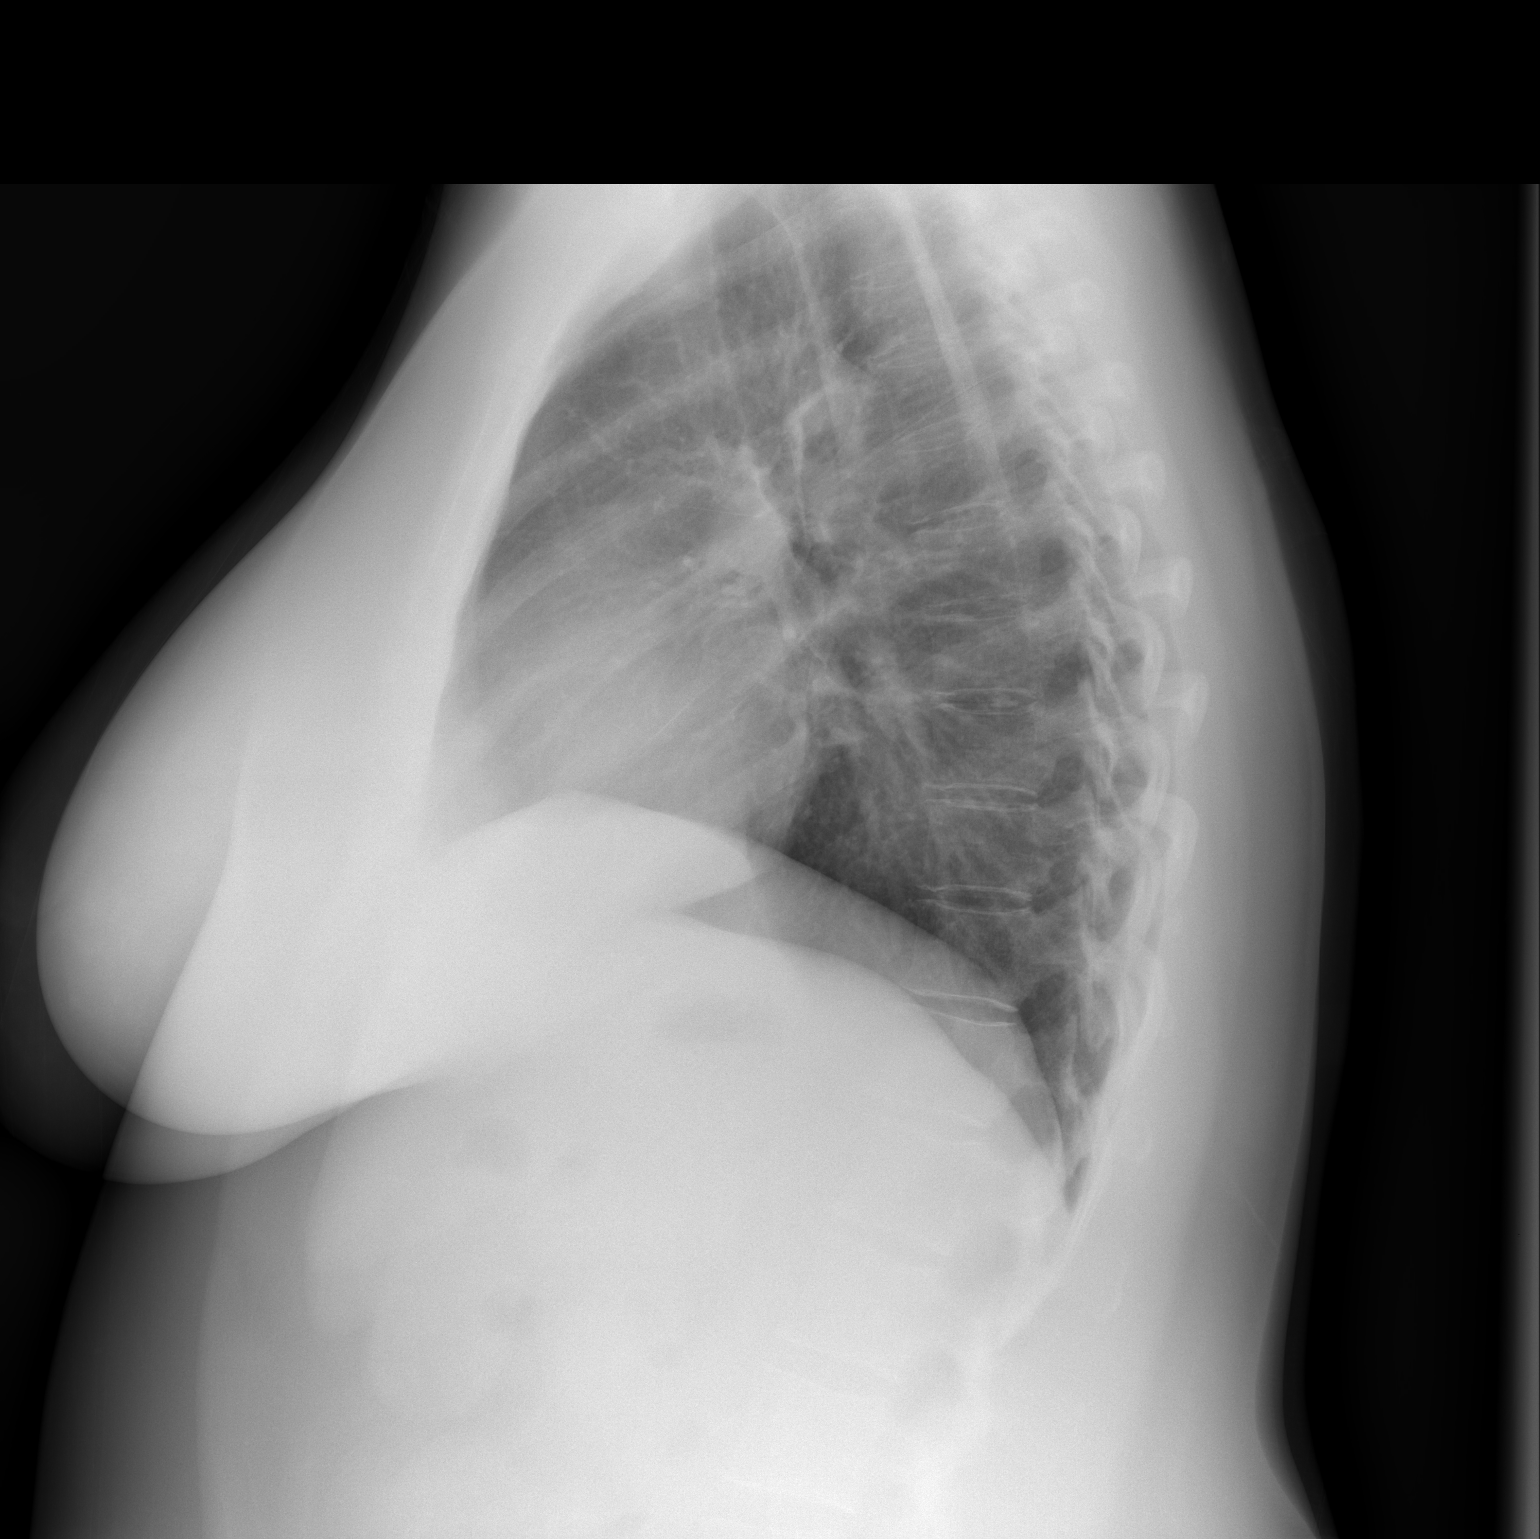

[2 of 2 positions shown; findings below may reference images not displayed]

FINDINGS: The heart size and mediastinal contours are within normal limits.
Both lungs are clear. The visualized skeletal structures are
unremarkable.
IMPRESSION: No active cardiopulmonary disease.

## 2017-04-15 ENCOUNTER — Encounter (HOSPITAL_COMMUNITY): Payer: Self-pay

## 2017-04-15 ENCOUNTER — Emergency Department (HOSPITAL_COMMUNITY)
Admission: EM | Admit: 2017-04-15 | Discharge: 2017-04-15 | Disposition: A | Discharge status: Home / Self Care | Payer: BLUE CROSS/BLUE SHIELD | Attending: Emergency Medicine | Admitting: Emergency Medicine

## 2017-04-15 DIAGNOSIS — Y939 Activity, unspecified: Secondary | ICD-10-CM | POA: Insufficient documentation

## 2017-04-15 DIAGNOSIS — S0591XA Unspecified injury of right eye and orbit, initial encounter: Secondary | ICD-10-CM | POA: Diagnosis present

## 2017-04-15 DIAGNOSIS — E039 Hypothyroidism, unspecified: Secondary | ICD-10-CM | POA: Insufficient documentation

## 2017-04-15 DIAGNOSIS — Z79899 Other long term (current) drug therapy: Secondary | ICD-10-CM | POA: Diagnosis not present

## 2017-04-15 DIAGNOSIS — Z23 Encounter for immunization: Secondary | ICD-10-CM | POA: Diagnosis not present

## 2017-04-15 DIAGNOSIS — Y92821 Forest as the place of occurrence of the external cause: Secondary | ICD-10-CM | POA: Diagnosis not present

## 2017-04-15 DIAGNOSIS — R030 Elevated blood-pressure reading, without diagnosis of hypertension: Secondary | ICD-10-CM | POA: Diagnosis not present

## 2017-04-15 DIAGNOSIS — H1089 Other conjunctivitis: Secondary | ICD-10-CM | POA: Diagnosis not present

## 2017-04-15 DIAGNOSIS — W228XXA Striking against or struck by other objects, initial encounter: Secondary | ICD-10-CM | POA: Diagnosis not present

## 2017-04-15 DIAGNOSIS — J45909 Unspecified asthma, uncomplicated: Secondary | ICD-10-CM | POA: Diagnosis not present

## 2017-04-15 DIAGNOSIS — Y999 Unspecified external cause status: Secondary | ICD-10-CM | POA: Diagnosis not present

## 2017-04-15 MED ORDER — KETOROLAC TROMETHAMINE 0.5 % OP SOLN
1.0000 [drp] | Freq: Once | OPHTHALMIC | Status: AC
  Administered 2017-04-15: 1 [drp] via OPHTHALMIC
  Filled 2017-04-15: qty 5

## 2017-04-15 MED ORDER — TETRACAINE HCL 0.5 % OP SOLN
2.0000 [drp] | Freq: Once | OPHTHALMIC | Status: AC
  Administered 2017-04-15: 2 [drp] via OPHTHALMIC
  Filled 2017-04-15: qty 4

## 2017-04-15 MED ORDER — TETANUS-DIPHTH-ACELL PERTUSSIS 5-2.5-18.5 LF-MCG/0.5 IM SUSP
0.5000 mL | Freq: Once | INTRAMUSCULAR | Status: AC
  Administered 2017-04-15: 0.5 mL via INTRAMUSCULAR
  Filled 2017-04-15: qty 0.5

## 2017-04-15 MED ORDER — TOBRAMYCIN 0.3 % OP SOLN
1.0000 [drp] | Freq: Once | OPHTHALMIC | Status: AC
  Administered 2017-04-15: 1 [drp] via OPHTHALMIC
  Filled 2017-04-15: qty 5

## 2017-04-15 MED ORDER — FLUORESCEIN SODIUM 1 MG OP STRP
1.0000 | ORAL_STRIP | Freq: Once | OPHTHALMIC | Status: AC
  Administered 2017-04-15: 1 via OPHTHALMIC
  Filled 2017-04-15: qty 1

## 2017-04-15 NOTE — Discharge Instructions (Signed)
Apply 1 drop of the tobrex antibiotic in your right eye every 4 hours while awake.  You may also apply one drop of the ketorolac (which can help with pain and inflammation) up to 4 times daily as needed.  Avoid wearing your contact as much as possible as discussed.

## 2017-04-15 NOTE — ED Notes (Signed)
ED Provider at bedside. 
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
## 2020-10-13 ENCOUNTER — Other Ambulatory Visit: Payer: Self-pay

## 2020-10-13 ENCOUNTER — Ambulatory Visit (INDEPENDENT_AMBULATORY_CARE_PROVIDER_SITE_OTHER): Payer: Self-pay | Admitting: Podiatry

## 2020-10-13 ENCOUNTER — Encounter: Payer: Self-pay | Admitting: Podiatry

## 2020-10-13 ENCOUNTER — Ambulatory Visit (INDEPENDENT_AMBULATORY_CARE_PROVIDER_SITE_OTHER): Payer: Self-pay

## 2020-10-13 DIAGNOSIS — M79672 Pain in left foot: Secondary | ICD-10-CM

## 2020-10-13 DIAGNOSIS — M7662 Achilles tendinitis, left leg: Secondary | ICD-10-CM

## 2020-10-18 ENCOUNTER — Telehealth: Payer: Self-pay | Admitting: *Deleted

## 2020-10-18 ENCOUNTER — Encounter: Payer: Self-pay | Admitting: Podiatry

## 2020-10-18 NOTE — Telephone Encounter (Signed)
IT can sometimes do that.  The boot may be rubbing against the back of the Achilles leading to some of the pain.  However ultimately boot does immobilize it.  Try to take it easy as much as possible.

## 2020-10-18 NOTE — Telephone Encounter (Signed)
Patient is calling because after wearing the boot , having a constant burning pain, hurts more to wear the boot and is this doing more harm than good? Please advise.

## 2020-10-18 NOTE — Telephone Encounter (Signed)
Returned call to patient and gave information per Dr  Eliane Decree note to remain in boot and rest as much as possible,verbalized understanding.

## 2020-10-18 NOTE — Progress Notes (Signed)
Subjective:  Patient ID: Kelli Harmon, female    DOB: Jan 03, 1971,  MRN: [id number]  Chief Complaint  Patient presents with  . Foot Pain    Left foot pain towards the back of the heel tender to the touch heat/ice does not help    50 y.o. female presents with the above complaint.  Patient presents with complaint of left Achilles tendon insertion pain.  Patient states it hurts in the back of the heel.  Ice and heat does not help.  She states it hard to walk on by the end of the day.  Is tender to touch.  She has not tried anything else for it.  She denies any other acute complaints.  She has not seen anyone else prior to seeing me.   Review of Systems: Negative except as noted in the HPI. Denies N/V/F/Ch.  Past Medical History:  Diagnosis Date  . Anxiety   . Asthma, exercise induced   . Environmental allergies   . GERD (gastroesophageal reflux disease)   . Headache(784.0)    MIGRAINES  . Hematuria   . History of hypothyroidism    early age 52's -- no issues since  . Left nephrolithiasis   . Pneumonia    03/16/2014  . Right ureteral stone   . Shortness of breath    exercise only   . Urgency of urination   . Wears contact lenses     Current Outpatient Medications:  .  hydrochlorothiazide (HYDRODIURIL) 25 MG tablet, , Disp: , Rfl: 0 .  isometheptene-acetaminophen-dichloralphenazone (MIDRIN) 65-100-325 MG capsule, take 1 capsule by mouth every 4 hours if needed, Disp: , Rfl: 0 .  MELATONIN PO, Take 1 tablet by mouth at bedtime as needed (sleep). , Disp: , Rfl:  .  Multiple Vitamins-Minerals (MULTIVITAMIN PO), Take 1 tablet by mouth daily. , Disp: , Rfl:  .  naproxen sodium (ALEVE) 220 MG tablet, Take 220 mg by mouth daily as needed (headache)., Disp: , Rfl:  .  sertraline (ZOLOFT) 50 MG tablet, , Disp: , Rfl: 0  Social History   Tobacco Use  Smoking Status Never Smoker  Smokeless Tobacco Never Used    Allergies  Allergen Reactions  . Codeine Itching and Anxiety    Objective:  There were no vitals filed for this visit. There is no height or weight on file to calculate BMI. Constitutional Well developed. Well nourished.  Vascular Dorsalis pedis pulses palpable bilaterally. Posterior tibial pulses palpable bilaterally. Capillary refill normal to all digits.  No cyanosis or clubbing noted. Pedal hair growth normal.  Neurologic Normal speech. Oriented to person, place, and time. Epicritic sensation to light touch grossly present bilaterally.  Dermatologic Nails well groomed and normal in appearance. No open wounds. No skin lesions.  Orthopedic:  Pain on palpation to the left Achilles tendon insertion.  Pain with dorsiflexion of the ankle joint.  No pain with plantarflexion of the ankle joint.  Palpable Haglund's deformity noted.  No pain at the posterior tibial tendon, peroneal tendon, ATFL ligament.   Radiographs: 3 views of skeletally mature the left foot: Posterior heel spur noted to the left Achilles insertion.  Positive Haglund's deformity as well.  Plantar heel spur noted as well.  No other bony abnormalities identified. Assessment:   1. Achilles tendinitis, left leg    Plan:  Patient was evaluated and treated and all questions answered.  Left Achilles tendinitis with posterior spur with underlying Haglund's deformity -I explained the patient the etiology of Achilles tendinitis and  various treatment options were discussed.  Given the amount of pain that she is having I believe she will benefit from cam boot immobilization.  Patient agrees to the plan would like to proceed with cam boot immobilization. -Cam boot was dispensed. -If there is no improvement we will consider steroid injection versus MRI evaluation.  No follow-ups on file.

## 2020-10-21 ENCOUNTER — Telehealth: Payer: Self-pay | Admitting: Podiatry

## 2020-10-21 NOTE — Telephone Encounter (Signed)
Pt left message on my line stating she has a boot that was given to her for her tendinitis and the strap has came unsown .   I returned call and told pt to bring the boot back to the office and we will replace it.

## 2020-11-09 ENCOUNTER — Encounter: Payer: Self-pay | Admitting: Podiatry

## 2020-11-09 ENCOUNTER — Other Ambulatory Visit: Payer: Self-pay

## 2020-11-09 ENCOUNTER — Ambulatory Visit (INDEPENDENT_AMBULATORY_CARE_PROVIDER_SITE_OTHER): Payer: Self-pay | Admitting: Podiatry

## 2020-11-09 DIAGNOSIS — M7662 Achilles tendinitis, left leg: Secondary | ICD-10-CM

## 2020-11-09 NOTE — Progress Notes (Signed)
Subjective:  Patient ID: Kelli Harmon, female    DOB: 1971/05/18,  MRN: [id number]  Chief Complaint  Patient presents with  . Foot Pain    PT stated that her foot is still causing her pain and she feels like the boot makes it worse     50 y.o. female presents with the above complaint.  Patient presents with follow-up of left Achilles tendon insertion pain.  Patient states it still hurts a lot.  She states that the boot did not help much.  She has been wearing the boot.  She denies any other acute complaint she would like to discuss next treatment options.  Review of Systems: Negative except as noted in the HPI. Denies N/V/F/Ch.  Past Medical History:  Diagnosis Date  . Anxiety   . Asthma, exercise induced   . Environmental allergies   . GERD (gastroesophageal reflux disease)   . Headache(784.0)    MIGRAINES  . Hematuria   . History of hypothyroidism    early age 62's -- no issues since  . Left nephrolithiasis   . Pneumonia    03/16/2014  . Right ureteral stone   . Shortness of breath    exercise only   . Urgency of urination   . Wears contact lenses     Current Outpatient Medications:  .  hydrochlorothiazide (HYDRODIURIL) 25 MG tablet, , Disp: , Rfl: 0 .  isometheptene-acetaminophen-dichloralphenazone (MIDRIN) 65-100-325 MG capsule, take 1 capsule by mouth every 4 hours if needed, Disp: , Rfl: 0 .  MELATONIN PO, Take 1 tablet by mouth at bedtime as needed (sleep). , Disp: , Rfl:  .  Multiple Vitamins-Minerals (MULTIVITAMIN PO), Take 1 tablet by mouth daily. , Disp: , Rfl:  .  naproxen sodium (ALEVE) 220 MG tablet, Take 220 mg by mouth daily as needed (headache)., Disp: , Rfl:  .  sertraline (ZOLOFT) 50 MG tablet, , Disp: , Rfl: 0  Social History   Tobacco Use  Smoking Status Never Smoker  Smokeless Tobacco Never Used    Allergies  Allergen Reactions  . Codeine Itching and Anxiety   Objective:  There were no vitals filed for this visit. There is no height or  weight on file to calculate BMI. Constitutional Well developed. Well nourished.  Vascular Dorsalis pedis pulses palpable bilaterally. Posterior tibial pulses palpable bilaterally. Capillary refill normal to all digits.  No cyanosis or clubbing noted. Pedal hair growth normal.  Neurologic Normal speech. Oriented to person, place, and time. Epicritic sensation to light touch grossly present bilaterally.  Dermatologic Nails well groomed and normal in appearance. No open wounds. No skin lesions.  Orthopedic:  Pain on palpation to the left Achilles tendon insertion.  Pain with dorsiflexion of the ankle joint.  No pain with plantarflexion of the ankle joint.  Palpable Haglund's deformity noted.  No pain at the posterior tibial tendon, peroneal tendon, ATFL ligament.   Radiographs: 3 views of skeletally mature the left foot: Posterior heel spur noted to the left Achilles insertion.  Positive Haglund's deformity as well.  Plantar heel spur noted as well.  No other bony abnormalities identified. Assessment:   1. Achilles tendinitis, left leg    Plan:  Patient was evaluated and treated and all questions answered.  Left Achilles tendinitis with posterior spur with underlying Haglund's deformity -I explained the patient the etiology of Achilles tendinitis and various treatment options were discussed.  I will hold off on steroid injection as a cam boot immobilization has not helped at  all. -Continue using cam boot -MRI will be ordered to assess the Achilles tendinitis and for acute tear  No follow-ups on file.

## 2020-11-20 ENCOUNTER — Ambulatory Visit
Admission: RE | Admit: 2020-11-20 | Discharge: 2020-11-20 | Disposition: A | Discharge status: Home / Self Care | Payer: BLUE CROSS/BLUE SHIELD | Source: Ambulatory Visit | Attending: Podiatry | Admitting: Podiatry

## 2020-11-20 DIAGNOSIS — M7662 Achilles tendinitis, left leg: Secondary | ICD-10-CM

## 2020-11-25 ENCOUNTER — Other Ambulatory Visit: Payer: BLUE CROSS/BLUE SHIELD

## 2020-11-30 ENCOUNTER — Ambulatory Visit (INDEPENDENT_AMBULATORY_CARE_PROVIDER_SITE_OTHER): Payer: Self-pay | Admitting: Podiatry

## 2020-11-30 ENCOUNTER — Other Ambulatory Visit: Payer: Self-pay

## 2020-11-30 DIAGNOSIS — M21862 Other specified acquired deformities of left lower leg: Secondary | ICD-10-CM

## 2020-11-30 DIAGNOSIS — Z01818 Encounter for other preprocedural examination: Secondary | ICD-10-CM

## 2020-11-30 DIAGNOSIS — M7662 Achilles tendinitis, left leg: Secondary | ICD-10-CM

## 2020-11-30 DIAGNOSIS — M216X2 Other acquired deformities of left foot: Secondary | ICD-10-CM

## 2020-11-30 DIAGNOSIS — M62462 Contracture of muscle, left lower leg: Secondary | ICD-10-CM

## 2020-12-06 ENCOUNTER — Encounter: Payer: Self-pay | Admitting: Podiatry

## 2020-12-06 NOTE — Progress Notes (Signed)
Subjective:  Patient ID: Kelli Harmon, female    DOB: [birth year],  MRN: [id number]  Chief Complaint  Patient presents with   Foot Pain    Left foot pain     50 y.o. female presents with the above complaint.  Patient presents with a follow-up of left Achilles tendinitis mid substance pain.  She states her pain has now been more focalized to Mr. Substance as opposed to the insertion.  She states she is doing a little bit better however she had her MRI done and would like to discuss surgical options as it still continues to hurt a lot she has not been able to ambulate properly.  She has tried all conservative treatment options none of which has helped.  She denies any other acute complaints.  Review of Systems: Negative except as noted in the HPI. Denies N/V/F/Ch.  Past Medical History:  Diagnosis Date   Anxiety    Asthma, exercise induced    Environmental allergies    GERD (gastroesophageal reflux disease)    Headache(784.0)    MIGRAINES   Hematuria    History of hypothyroidism    early age 12's -- no issues since   Left nephrolithiasis    Pneumonia    03/16/2014   Right ureteral stone    Shortness of breath    exercise only    Urgency of urination    Wears contact lenses     Current Outpatient Medications:    hydrochlorothiazide (HYDRODIURIL) 25 MG tablet, , Disp: , Rfl: 0   isometheptene-acetaminophen-dichloralphenazone (MIDRIN) 65-100-325 MG capsule, take 1 capsule by mouth every 4 hours if needed, Disp: , Rfl: 0   MELATONIN PO, Take 1 tablet by mouth at bedtime as needed (sleep). , Disp: , Rfl:    Multiple Vitamins-Minerals (MULTIVITAMIN PO), Take 1 tablet by mouth daily. , Disp: , Rfl:    naproxen sodium (ALEVE) 220 MG tablet, Take 220 mg by mouth daily as needed (headache)., Disp: , Rfl:    sertraline (ZOLOFT) 50 MG tablet, , Disp: , Rfl: 0  Social History   Tobacco Use  Smoking Status Never  Smokeless Tobacco Never    Allergies  Allergen Reactions   Codeine  Itching and Anxiety   Objective:  There were no vitals filed for this visit. There is no height or weight on file to calculate BMI. Constitutional Well developed. Well nourished.  Vascular Dorsalis pedis pulses palpable bilaterally. Posterior tibial pulses palpable bilaterally. Capillary refill normal to all digits.  No cyanosis or clubbing noted. Pedal hair growth normal.  Neurologic Normal speech. Oriented to person, place, and time. Epicritic sensation to light touch grossly present bilaterally.  Dermatologic Nails well groomed and normal in appearance. No open wounds. No skin lesions.  Orthopedic:  Pain on palpation to the left Achilles tendon midsubstance.  Pain with dorsiflexion of the ankle joint.  No pain with plantarflexion of the ankle joint.  Palpable Haglund's deformity noted.  No pain at the posterior tibial tendon, peroneal tendon, ATFL ligament.  Positive Silfverskiold test with underlying ankle equinus gastrocnemius   Radiographs: 3 views of skeletally mature the left foot: Posterior heel spur noted to the left Achilles insertion.  Positive Haglund's deformity as well.  Plantar heel spur noted as well.  No other bony abnormalities identified.  1. Achilles tendinosis with partial-thickness interstitial tearing located approximately 5 cm proximal to the tendon insertion. No partial thickness or full-thickness tear of the distal tendon insertion. 2. Long segment longitudinal split tear of  the infra-malleolar aspect of the peroneus brevis tendon. 3. Small volume tenosynovial fluid within the posteromedial ankle tendon sheaths. Assessment:   1. Gastrocnemius equinus of left lower extremity   2. Achilles tendinitis, left leg   3. Preoperative examination     Plan:  Patient was evaluated and treated and all questions answered.  Left Achilles tendinitis mid substance pain localized with underlying ankle equinus -I explained to the patient the etiology of Achilles  tendinitis and various treatment options were discussed.  Originally when she saw me she had more of an insertional pain However it may have been masking the underlying mid substance pain.  At this time given that she no longer has insertional pain and mostly mid substance pain which correlates with MRI findings I will primarily focus on repairing the Achilles tendon as opposed to doing resection of Haglund's deformity with resection of insertional spur.  I discussed this with the patient in extensive detail she states understanding would like to proceed with repairing of the Achilles tendon. -MRI findings were reviewed as listed above.  Her pain is only in the Achilles tendon we will primarily focus on that. -I discussed my preoperative intraoperative and postoperative plan in extensive detail.  She will also benefit from underlying gastrocnemius recession as she does have ankle equinus.  I discussed with her she will have to be nonweightbearing to that left lower extremity for 6 to 8 weeks.  She states understanding and will do so. -My surgical plan includes left primary repair of the Achilles tendon with gastrocnemius recession -Informed surgical risk consent was reviewed and read aloud to the patient.  I reviewed the films.  I have discussed my findings with the patient in great detail.  I have discussed all risks including but not limited to infection, stiffness, scarring, limp, disability, deformity, damage to blood vessels and nerves, numbness, poor healing, need for braces, arthritis, chronic pain, amputation, death.  All benefits and realistic expectations discussed in great detail.  I have made no promises as to the outcome.  I have provided realistic expectations.  I have offered the patient a 2nd opinion, which they have declined and assured me they preferred to proceed despite the risks   No follow-ups on file.

## 2021-01-09 ENCOUNTER — Other Ambulatory Visit: Payer: Self-pay | Admitting: Podiatry

## 2021-01-09 ENCOUNTER — Encounter: Payer: Self-pay | Admitting: Podiatry

## 2021-01-09 DIAGNOSIS — M216X2 Other acquired deformities of left foot: Secondary | ICD-10-CM

## 2021-01-09 DIAGNOSIS — M7662 Achilles tendinitis, left leg: Secondary | ICD-10-CM

## 2021-01-09 MED ORDER — IBUPROFEN 800 MG PO TABS: 800.0000 mg | ORAL_TABLET | Freq: Four times a day (QID) | ORAL | 1 refills | Status: AC | PRN

## 2021-01-09 MED ORDER — OXYCODONE-ACETAMINOPHEN 5-325 MG PO TABS: 1.0000 | ORAL_TABLET | ORAL | 0 refills | Status: AC | PRN

## 2021-01-18 ENCOUNTER — Ambulatory Visit (INDEPENDENT_AMBULATORY_CARE_PROVIDER_SITE_OTHER): Payer: Self-pay | Admitting: Podiatry

## 2021-01-18 ENCOUNTER — Other Ambulatory Visit: Payer: Self-pay

## 2021-01-18 ENCOUNTER — Encounter: Payer: Self-pay | Admitting: Podiatry

## 2021-01-18 DIAGNOSIS — M216X2 Other acquired deformities of left foot: Secondary | ICD-10-CM

## 2021-01-18 DIAGNOSIS — M7662 Achilles tendinitis, left leg: Secondary | ICD-10-CM

## 2021-01-18 DIAGNOSIS — Z9889 Other specified postprocedural states: Secondary | ICD-10-CM

## 2021-01-18 DIAGNOSIS — M21862 Other specified acquired deformities of left lower leg: Secondary | ICD-10-CM

## 2021-01-18 NOTE — Progress Notes (Signed)
  Subjective:  Patient ID: Kelli Harmon, female    DOB: March 23, 1971,  MRN: [id number]  Chief Complaint  Patient presents with   Routine Post Op    POST OP     DOS: 01/09/2021 Procedure: Left Achilles tendon rupture with gastrocnemius recession  50 y.o. female returns for post-op check.  Patient states she is doing well.  She has not been taking much pain medication.  She has been nonweightbearing to the left lower extremity.  She denies any other acute complaints.  Review of Systems: Negative except as noted in the HPI. Denies N/V/F/Ch.  Past Medical History:  Diagnosis Date   Anxiety    Asthma, exercise induced    Environmental allergies    GERD (gastroesophageal reflux disease)    Headache(784.0)    MIGRAINES   Hematuria    History of hypothyroidism    early age 58's -- no issues since   Left nephrolithiasis    Pneumonia    03/16/2014   Right ureteral stone    Shortness of breath    exercise only    Urgency of urination    Wears contact lenses     Current Outpatient Medications:    hydrochlorothiazide (HYDRODIURIL) 25 MG tablet, , Disp: , Rfl: 0   ibuprofen (ADVIL) 800 MG tablet, Take 1 tablet (800 mg total) by mouth every 6 (six) hours as needed., Disp: 60 tablet, Rfl: 1   isometheptene-acetaminophen-dichloralphenazone (MIDRIN) 65-100-325 MG capsule, take 1 capsule by mouth every 4 hours if needed, Disp: , Rfl: 0   MELATONIN PO, Take 1 tablet by mouth at bedtime as needed (sleep). , Disp: , Rfl:    Multiple Vitamins-Minerals (MULTIVITAMIN PO), Take 1 tablet by mouth daily. , Disp: , Rfl:    naproxen sodium (ALEVE) 220 MG tablet, Take 220 mg by mouth daily as needed (headache)., Disp: , Rfl:    oxyCODONE-acetaminophen (PERCOCET) 5-325 MG tablet, Take 1-2 tablets by mouth every 4 (four) hours as needed for severe pain., Disp: 30 tablet, Rfl: 0   sertraline (ZOLOFT) 50 MG tablet, , Disp: , Rfl: 0  Social History   Tobacco Use  Smoking Status Never  Smokeless Tobacco  Never    Allergies  Allergen Reactions   Codeine Itching and Anxiety   Objective:  There were no vitals filed for this visit. There is no height or weight on file to calculate BMI. Constitutional Well developed. Well nourished.  Vascular Foot warm and well perfused. Capillary refill normal to all digits.   Neurologic Normal speech. Oriented to person, place, and time. Epicritic sensation to light touch grossly present bilaterally.  Dermatologic Skin healing well without signs of infection. Skin edges well coapted without signs of infection.  Orthopedic: Tenderness to palpation noted about the surgical site.   Radiographs: None Assessment:   1. Gastrocnemius equinus of left lower extremity   2. Achilles tendinitis, left leg   3. Status post foot surgery    Plan:  Patient was evaluated and treated and all questions answered.  S/p foot surgery left -Progressing as expected post-operatively. -XR: None -WB Status: Nonweightbearing left lower extremity in knee scooter -Sutures/staples: Intact.  No clinical signs of dehiscence noted.  No complication noted. -Medications: None -Foot redressed.  No follow-ups on file.

## 2021-02-01 ENCOUNTER — Other Ambulatory Visit: Payer: Self-pay

## 2021-02-01 ENCOUNTER — Ambulatory Visit (INDEPENDENT_AMBULATORY_CARE_PROVIDER_SITE_OTHER): Payer: Self-pay | Admitting: Podiatry

## 2021-02-01 DIAGNOSIS — M21862 Other specified acquired deformities of left lower leg: Secondary | ICD-10-CM

## 2021-02-01 DIAGNOSIS — M7662 Achilles tendinitis, left leg: Secondary | ICD-10-CM

## 2021-02-01 DIAGNOSIS — M216X2 Other acquired deformities of left foot: Secondary | ICD-10-CM

## 2021-02-01 DIAGNOSIS — Z9889 Other specified postprocedural states: Secondary | ICD-10-CM

## 2021-02-03 ENCOUNTER — Ambulatory Visit (INDEPENDENT_AMBULATORY_CARE_PROVIDER_SITE_OTHER): Payer: Self-pay | Admitting: Podiatry

## 2021-02-03 ENCOUNTER — Other Ambulatory Visit: Payer: Self-pay

## 2021-02-03 ENCOUNTER — Ambulatory Visit (INDEPENDENT_AMBULATORY_CARE_PROVIDER_SITE_OTHER): Payer: Self-pay

## 2021-02-03 DIAGNOSIS — M216X2 Other acquired deformities of left foot: Secondary | ICD-10-CM

## 2021-02-03 DIAGNOSIS — Z9889 Other specified postprocedural states: Secondary | ICD-10-CM

## 2021-02-03 DIAGNOSIS — M7662 Achilles tendinitis, left leg: Secondary | ICD-10-CM

## 2021-02-03 DIAGNOSIS — M21862 Other specified acquired deformities of left lower leg: Secondary | ICD-10-CM

## 2021-02-03 NOTE — Progress Notes (Signed)
  Subjective:  Patient ID: Kelli Harmon, female    DOB: 02/02/1971,  MRN: [id number]  Chief Complaint  Patient presents with   Routine Post Op    POV #2 DOS 01/09/2021 LT ACHILLES TENDON REPAIR W/GASTROCNEMIUS RECESSION    DOS: 01/09/2021 Procedure: Left Achilles tendon rupture with gastrocnemius recession  50 y.o. female returns for post-op check.  Patient states she is doing well.  She is doing well.  She has been nonweightbearing to the left lower extremity.  She would like to know when she can have the staple removed and when she can start ambulating.  Review of Systems: Negative except as noted in the HPI. Denies N/V/F/Ch.  Past Medical History:  Diagnosis Date   Anxiety    Asthma, exercise induced    Environmental allergies    GERD (gastroesophageal reflux disease)    Headache(784.0)    MIGRAINES   Hematuria    History of hypothyroidism    early age 67's -- no issues since   Left nephrolithiasis    Pneumonia    03/16/2014   Right ureteral stone    Shortness of breath    exercise only    Urgency of urination    Wears contact lenses     Current Outpatient Medications:    hydrochlorothiazide (HYDRODIURIL) 25 MG tablet, , Disp: , Rfl: 0   ibuprofen (ADVIL) 800 MG tablet, Take 1 tablet (800 mg total) by mouth every 6 (six) hours as needed., Disp: 60 tablet, Rfl: 1   isometheptene-acetaminophen-dichloralphenazone (MIDRIN) 65-100-325 MG capsule, take 1 capsule by mouth every 4 hours if needed, Disp: , Rfl: 0   MELATONIN PO, Take 1 tablet by mouth at bedtime as needed (sleep). , Disp: , Rfl:    Multiple Vitamins-Minerals (MULTIVITAMIN PO), Take 1 tablet by mouth daily. , Disp: , Rfl:    naproxen sodium (ALEVE) 220 MG tablet, Take 220 mg by mouth daily as needed (headache)., Disp: , Rfl:    oxyCODONE-acetaminophen (PERCOCET) 5-325 MG tablet, Take 1-2 tablets by mouth every 4 (four) hours as needed for severe pain., Disp: 30 tablet, Rfl: 0   sertraline (ZOLOFT) 50 MG tablet, ,  Disp: , Rfl: 0  Social History   Tobacco Use  Smoking Status Never  Smokeless Tobacco Never    Allergies  Allergen Reactions   Codeine Itching and Anxiety   Objective:  There were no vitals filed for this visit. There is no height or weight on file to calculate BMI. Constitutional Well developed. Well nourished.  Vascular Foot warm and well perfused. Capillary refill normal to all digits.   Neurologic Normal speech. Oriented to person, place, and time. Epicritic sensation to light touch grossly present bilaterally.  Dermatologic Skin has completely epithelialized.  No clinical signs of infection noted.  Good range of motion noted at the ankle joint.  10 degrees past ankle joint noted.  No pain on palpation to the incision site  Orthopedic: No tenderness to palpation noted about the surgical site.   Radiographs: None Assessment:   1. Achilles tendinitis, left leg   2. Gastrocnemius equinus of left lower extremity   3. Status post foot surgery     Plan:  Patient was evaluated and treated and all questions answered.  S/p foot surgery left -Progressing as expected post-operatively. -XR: None -WB Status: Begin weightbearing as tolerated in cam boot -Sutures/staples: Removed no clinical signs of dehiscence noted.  No complication noted. -Medications: None -Foot redressed.  No follow-ups on file.

## 2021-02-08 NOTE — Progress Notes (Signed)
Subjective:  Patient ID: Kelli Harmon, female    DOB: Apr 16, 1971,  MRN: [id number]  Chief Complaint  Patient presents with   Post-op Problem    PT states that she has a suture coming from incision site. Pt came to have sutured removed.     DOS: 01/09/2021 Procedure: Left Achilles tendon rupture with gastrocnemius recession  50 y.o. female returns for post-op check.  Patient is here to get because she saw saw staple or suture kind of sticking out of the leg.  It appears that she may have had one of the deep stitches kind of sticking out of the incision.  She just wanted to have it removed.  Review of Systems: Negative except as noted in the HPI. Denies N/V/F/Ch.  Past Medical History:  Diagnosis Date   Anxiety    Asthma, exercise induced    Environmental allergies    GERD (gastroesophageal reflux disease)    Headache(784.0)    MIGRAINES   Hematuria    History of hypothyroidism    early age 21's -- no issues since   Left nephrolithiasis    Pneumonia    03/16/2014   Right ureteral stone    Shortness of breath    exercise only    Urgency of urination    Wears contact lenses     Current Outpatient Medications:    hydrochlorothiazide (HYDRODIURIL) 25 MG tablet, , Disp: , Rfl: 0   ibuprofen (ADVIL) 800 MG tablet, Take 1 tablet (800 mg total) by mouth every 6 (six) hours as needed., Disp: 60 tablet, Rfl: 1   isometheptene-acetaminophen-dichloralphenazone (MIDRIN) 65-100-325 MG capsule, take 1 capsule by mouth every 4 hours if needed, Disp: , Rfl: 0   MELATONIN PO, Take 1 tablet by mouth at bedtime as needed (sleep). , Disp: , Rfl:    Multiple Vitamins-Minerals (MULTIVITAMIN PO), Take 1 tablet by mouth daily. , Disp: , Rfl:    naproxen sodium (ALEVE) 220 MG tablet, Take 220 mg by mouth daily as needed (headache)., Disp: , Rfl:    oxyCODONE-acetaminophen (PERCOCET) 5-325 MG tablet, Take 1-2 tablets by mouth every 4 (four) hours as needed for severe pain., Disp: 30 tablet, Rfl: 0    sertraline (ZOLOFT) 50 MG tablet, , Disp: , Rfl: 0  Social History   Tobacco Use  Smoking Status Never  Smokeless Tobacco Never    Allergies  Allergen Reactions   Codeine Itching and Anxiety   Objective:  There were no vitals filed for this visit. There is no height or weight on file to calculate BMI. Constitutional Well developed. Well nourished.  Vascular Foot warm and well perfused. Capillary refill normal to all digits.   Neurologic Normal speech. Oriented to person, place, and time. Epicritic sensation to light touch grossly present bilaterally.  Dermatologic Skin has completely epithelialized.  No clinical signs of infection noted.  Good range of motion noted at the ankle joint.  10 degrees past ankle joint noted.  No pain on palpation to the incision site  Orthopedic: No tenderness to palpation noted about the surgical site.   Radiographs: None Assessment:   1. Achilles tendinitis, left leg   2. Gastrocnemius equinus of left lower extremity   3. Status post foot surgery     Plan:  Patient was evaluated and treated and all questions answered.  S/p foot surgery left -Progressing as expected post-operatively. -XR: None -WB Status: Begin weightbearing as tolerated in cam boot -Sutures/staples: The deep suture was clipped.  No dehiscence noted. -Medications:  None -Foot redressed.  No follow-ups on file.

## 2021-03-01 ENCOUNTER — Ambulatory Visit (INDEPENDENT_AMBULATORY_CARE_PROVIDER_SITE_OTHER): Payer: Self-pay | Admitting: Podiatry

## 2021-03-01 ENCOUNTER — Other Ambulatory Visit: Payer: Self-pay

## 2021-03-01 ENCOUNTER — Encounter: Payer: Self-pay | Admitting: Podiatry

## 2021-03-01 DIAGNOSIS — M21862 Other specified acquired deformities of left lower leg: Secondary | ICD-10-CM

## 2021-03-01 DIAGNOSIS — M7662 Achilles tendinitis, left leg: Secondary | ICD-10-CM

## 2021-03-01 DIAGNOSIS — Z9889 Other specified postprocedural states: Secondary | ICD-10-CM

## 2021-03-01 DIAGNOSIS — M216X2 Other acquired deformities of left foot: Secondary | ICD-10-CM

## 2021-03-06 NOTE — Progress Notes (Signed)
Subjective:  Patient ID: Kelli Harmon, female    DOB: 1971-03-24,  MRN: [id number]  Chief Complaint  Patient presents with   Routine Post Op     POV #3 DOS 01/09/2021 LT ACHILLES TENDON REPAIR W/GASTROCNEMIUS RECESSION    DOS: 01/09/2021 Procedure: Left Achilles tendon rupture with gastrocnemius recession  50 y.o. female returns for post-op check.  She states she is doing well.  She is ambulating in regular shoes.  She states that she is continuing doing home physical therapy.  She denies any other acute complaints  Review of Systems: Negative except as noted in the HPI. Denies N/V/F/Ch.  Past Medical History:  Diagnosis Date   Anxiety    Asthma, exercise induced    Environmental allergies    GERD (gastroesophageal reflux disease)    Headache(784.0)    MIGRAINES   Hematuria    History of hypothyroidism    early age 70's -- no issues since   Left nephrolithiasis    Pneumonia    03/16/2014   Right ureteral stone    Shortness of breath    exercise only    Urgency of urination    Wears contact lenses     Current Outpatient Medications:    hydrochlorothiazide (HYDRODIURIL) 25 MG tablet, , Disp: , Rfl: 0   ibuprofen (ADVIL) 800 MG tablet, Take 1 tablet (800 mg total) by mouth every 6 (six) hours as needed., Disp: 60 tablet, Rfl: 1   isometheptene-acetaminophen-dichloralphenazone (MIDRIN) 65-100-325 MG capsule, take 1 capsule by mouth every 4 hours if needed, Disp: , Rfl: 0   MELATONIN PO, Take 1 tablet by mouth at bedtime as needed (sleep). , Disp: , Rfl:    Multiple Vitamins-Minerals (MULTIVITAMIN PO), Take 1 tablet by mouth daily. , Disp: , Rfl:    naproxen sodium (ALEVE) 220 MG tablet, Take 220 mg by mouth daily as needed (headache)., Disp: , Rfl:    oxyCODONE-acetaminophen (PERCOCET) 5-325 MG tablet, Take 1-2 tablets by mouth every 4 (four) hours as needed for severe pain., Disp: 30 tablet, Rfl: 0   sertraline (ZOLOFT) 50 MG tablet, , Disp: , Rfl: 0  Social History    Tobacco Use  Smoking Status Never  Smokeless Tobacco Never    Allergies  Allergen Reactions   Codeine Itching and Anxiety   Objective:  There were no vitals filed for this visit. There is no height or weight on file to calculate BMI. Constitutional Well developed. Well nourished.  Vascular Foot warm and well perfused. Capillary refill normal to all digits.   Neurologic Normal speech. Oriented to person, place, and time. Epicritic sensation to light touch grossly present bilaterally.  Dermatologic Skin has completely epithelialized.  No clinical signs of infection noted.  Good range of motion noted at the ankle joint.  10 degrees past ankle joint noted.  No pain on palpation to the incision site  Orthopedic: No tenderness to palpation noted about the surgical site.   Radiographs: None Assessment:   1. Achilles tendinitis, left leg   2. Gastrocnemius equinus of left lower extremity   3. Status post foot surgery      Plan:  Patient was evaluated and treated and all questions answered.  S/p foot surgery left -Progressing as expected post-operatively. -XR: None -WB Status: Weightbearing as tolerated in regular shoes -Sutures/staples: None -Medications: None -Patient would like to continue doing home physical therapy.  Her range of motion has improved considerably.  If any foot and ankle issues arise in the future I  have asked her to come see me.  At this time patient is officially discharged from my care.  She states understanding.  No follow-ups on file.

## 2022-03-11 DIAGNOSIS — H65192 Other acute nonsuppurative otitis media, left ear: Secondary | ICD-10-CM | POA: Diagnosis not present

## 2022-03-30 DIAGNOSIS — E039 Hypothyroidism, unspecified: Secondary | ICD-10-CM | POA: Diagnosis not present

## 2022-04-30 DIAGNOSIS — Z800 Family history of malignant neoplasm of digestive organs: Secondary | ICD-10-CM | POA: Diagnosis not present

## 2022-05-11 ENCOUNTER — Encounter: Payer: Self-pay | Admitting: Podiatrist

## 2022-05-11 ENCOUNTER — Ambulatory Visit (INDEPENDENT_AMBULATORY_CARE_PROVIDER_SITE_OTHER): Payer: 59

## 2022-05-11 ENCOUNTER — Ambulatory Visit: Payer: 59 | Admitting: Podiatrist

## 2022-05-11 DIAGNOSIS — M7661 Achilles tendinitis, right leg: Secondary | ICD-10-CM | POA: Diagnosis not present

## 2022-05-11 DIAGNOSIS — M25571 Pain in right ankle and joints of right foot: Secondary | ICD-10-CM

## 2022-05-11 NOTE — Progress Notes (Signed)
Chief Complaint  Patient presents with   Ankle Pain    Right ankle pain     HPI: Patient is 51 y.o. female who presents today for pain in the right posterior heel and lateral ankle.  She relates that the pain she is experiencing in her right lower leg is similar to that which she experienced in her left lower leg when she had torn her Achilles tendon and lateral ankle tendon.  She denies any trauma or injury.  She relates the pain started about 3 months ago and that she has been in a boot consistently for the last 6 weeks in duration.  She relates no improvement despite wearing the boot and is concerned she may have torn a tendon in her right Achilles area.  She relates pain with each step and pain when sleeping.  She has tried to sleep in the boot however she is unable to find a comfortable orientation for her foot in the boot.  Patient Active Problem List   Diagnosis Date Noted   Migraine with typical aura 01/05/2013   History of loop electrical excision procedure (LEEP) 01/05/2013    Current Outpatient Medications on File Prior to Visit  Medication Sig Dispense Refill   hydrochlorothiazide (HYDRODIURIL) 25 MG tablet   0   ibuprofen (ADVIL) 800 MG tablet Take 1 tablet (800 mg total) by mouth every 6 (six) hours as needed. 60 tablet 1   isometheptene-acetaminophen-dichloralphenazone (MIDRIN) 65-100-325 MG capsule take 1 capsule by mouth every 4 hours if needed  0   MELATONIN PO Take 1 tablet by mouth at bedtime as needed (sleep).      Multiple Vitamins-Minerals (MULTIVITAMIN PO) Take 1 tablet by mouth daily.      naproxen sodium (ALEVE) 220 MG tablet Take 220 mg by mouth daily as needed (headache).     oxyCODONE-acetaminophen (PERCOCET) 5-325 MG tablet Take 1-2 tablets by mouth every 4 (four) hours as needed for severe pain. 30 tablet 0   sertraline (ZOLOFT) 50 MG tablet   0   No current facility-administered medications on file prior to visit.    Allergies  Allergen Reactions    Codeine Itching and Anxiety    Review of Systems No fevers, chills, nausea, muscle aches, no difficulty breathing, no calf pain, no chest pain or shortness of breath.   Physical Exam  GENERAL APPEARANCE: Alert, conversant. Appropriately groomed. No acute distress.   VASCULAR: Pedal pulses palpable 2/4 DP and  PT bilateral.  Capillary refill time is immediate to all digits,  Proximal to distal cooling is warm to warm.  Digital perfusion adequate.   NEUROLOGIC: sensation is intact to 5.07 monofilament at 5/5 sites bilateral.  Light touch is intact bilateral, vibratory sensation intact bilateral  MUSCULOSKELETAL: acceptable muscle strength, tone and stability on the left side.  On the right side pain on palpation posterior aspect of the Achilles tendon at its insertion site is noted.  Plantarflexion with Janee Morn test is noted right.  Pain along the lateral ankle along the Course of the peroneal tendon is noted as well.  DERMATOLOGIC: skin is warm, supple, and dry.  Color, texture, and turgor of skin within normal limits.  No open wounds are noted.  No preulcerative lesions are seen.  Digital nails are asymptomatic.    X-rays are taken today are negative for any acute fracture or dislocation. Increase soft tissue swelling present on the posterior aspect of the lower leg at the area of pain at the achilles.  See  full read for futher details on xray report.   Assessment     ICD-10-CM   1. Right ankle pain, unspecified chronicity  M25.571 DG Foot Complete Right    2. Tendonitis, Achilles, right  M76.61        Plan Discussed exam and xray findings.  She has already been wearing the boot for the past 6 weeks and no improvement is noted. I recommend we get an mri to rule out a tear of the tendon since she has had similar symptoms to the left and there was a tear that required surgery.  An MRI will be ordered.  I will have her follow up with Dr. Posey Pronto in 3-4 weeks to discuss MRI results and to  offer a treatment plan based on MRI.  If any questions arise in the meantime she will call and will keep wearing the boot.  Also I recommended she order a night splint to sleep in-  an adjustable one was recommended as we did not have her size in [place].

## 2022-05-25 ENCOUNTER — Telehealth: Payer: Self-pay | Admitting: *Deleted

## 2022-05-25 NOTE — Telephone Encounter (Signed)
Faxed clinicals /demo, waiting on review, should have an answer in 2 bus. Days and not exceeding 15 calendar days.   Case # [id number] Contact: Rolanda A.

## 2022-05-28 ENCOUNTER — Telehealth: Payer: Self-pay | Admitting: Podiatrist

## 2022-05-28 NOTE — Telephone Encounter (Signed)
[facility] Prior Auth needed for MRI. Error

## 2022-06-01 ENCOUNTER — Ambulatory Visit: Payer: 59 | Admitting: Podiatry

## 2022-06-11 ENCOUNTER — Telehealth: Payer: Self-pay | Admitting: *Deleted

## 2022-06-11 NOTE — Telephone Encounter (Signed)
Patient has been approved for MRI,scheduled for 06/16/22,approval date:05/25/22. Patient is aware.

## 2022-06-16 ENCOUNTER — Ambulatory Visit (INDEPENDENT_AMBULATORY_CARE_PROVIDER_SITE_OTHER): Payer: 59

## 2022-06-16 DIAGNOSIS — M65871 Other synovitis and tenosynovitis, right ankle and foot: Secondary | ICD-10-CM | POA: Diagnosis not present

## 2022-06-16 DIAGNOSIS — M25571 Pain in right ankle and joints of right foot: Secondary | ICD-10-CM | POA: Diagnosis not present

## 2022-06-16 DIAGNOSIS — S86311A Strain of muscle(s) and tendon(s) of peroneal muscle group at lower leg level, right leg, initial encounter: Secondary | ICD-10-CM | POA: Diagnosis not present

## 2022-06-16 DIAGNOSIS — M7661 Achilles tendinitis, right leg: Secondary | ICD-10-CM

## 2022-07-05 ENCOUNTER — Encounter: Payer: Self-pay | Admitting: Podiatry

## 2022-07-05 ENCOUNTER — Ambulatory Visit: Payer: 59 | Admitting: Podiatry

## 2022-07-05 VITALS — BP 120/68

## 2022-07-05 DIAGNOSIS — M7671 Peroneal tendinitis, right leg: Secondary | ICD-10-CM | POA: Diagnosis not present

## 2022-07-05 DIAGNOSIS — Z01818 Encounter for other preprocedural examination: Secondary | ICD-10-CM

## 2022-07-05 DIAGNOSIS — M7661 Achilles tendinitis, right leg: Secondary | ICD-10-CM | POA: Diagnosis not present

## 2022-07-05 NOTE — Progress Notes (Signed)
Subjective:  Patient ID: Kelli Harmon, female    DOB: 04/24/1971,  MRN: [id number]  Chief Complaint  Patient presents with   MRI results    Pt is here to talk about her results of the MRI she had done     52 y.o. female presents with the above complaint.  Patient here presents with right Achilles tendon insertional pain as well as peroneal tendinitis pain.  Patient states is painful to touch is progressive gotten worse.  She would like to discuss treatment options she was seen by Dr. Derrick Ravel who did the MRI and is here for further consultation.  She states that she has failed all conservative care including padding protecting immobilization nothing has helped.  She would like to discuss surgical options at this time.  Pain scale 7 out of 10 sharp shooting in nature and with ambulation   Review of Systems: Negative except as noted in the HPI. Denies N/V/F/Ch.  Past Medical History:  Diagnosis Date   Anxiety    Asthma, exercise induced    Environmental allergies    GERD (gastroesophageal reflux disease)    Headache(784.0)    MIGRAINES   Hematuria    History of hypothyroidism    early age 51's -- no issues since   Left nephrolithiasis    Pneumonia    03/16/2014   Right ureteral stone    Shortness of breath    exercise only    Urgency of urination    Wears contact lenses     Current Outpatient Medications:    amLODipine (NORVASC) 5 MG tablet, Take 5 mg by mouth daily., Disp: , Rfl:    amoxicillin (AMOXIL) 500 MG capsule, Take by mouth., Disp: , Rfl:    amoxicillin (AMOXIL) 875 MG tablet, Take 875 mg by mouth 2 (two) times daily., Disp: , Rfl:    chlorhexidine (PERIDEX) 0.12 % solution, SMARTSIG:0.5 Ounce(s) By Mouth Morning-Night, Disp: , Rfl:    levothyroxine (SYNTHROID) 75 MCG tablet, Take 75 mcg by mouth daily., Disp: , Rfl:    SYNTHROID 25 MCG tablet, Take 25 mcg by mouth every morning., Disp: , Rfl:    SYNTHROID 50 MCG tablet, Take 50 mcg by mouth every morning., Disp: ,  Rfl:    hydrochlorothiazide (HYDRODIURIL) 25 MG tablet, , Disp: , Rfl: 0   ibuprofen (ADVIL) 800 MG tablet, Take 1 tablet (800 mg total) by mouth every 6 (six) hours as needed., Disp: 60 tablet, Rfl: 1   isometheptene-acetaminophen-dichloralphenazone (MIDRIN) 65-100-325 MG capsule, take 1 capsule by mouth every 4 hours if needed, Disp: , Rfl: 0   MELATONIN PO, Take 1 tablet by mouth at bedtime as needed (sleep). , Disp: , Rfl:    Multiple Vitamins-Minerals (MULTIVITAMIN PO), Take 1 tablet by mouth daily. , Disp: , Rfl:    naproxen sodium (ALEVE) 220 MG tablet, Take 220 mg by mouth daily as needed (headache)., Disp: , Rfl:    oxyCODONE-acetaminophen (PERCOCET) 5-325 MG tablet, Take 1-2 tablets by mouth every 4 (four) hours as needed for severe pain., Disp: 30 tablet, Rfl: 0   sertraline (ZOLOFT) 50 MG tablet, , Disp: , Rfl: 0  Social History   Tobacco Use  Smoking Status Never  Smokeless Tobacco Never    Allergies  Allergen Reactions   Codeine Itching and Anxiety   Objective:   Vitals:   07/05/22 1412  BP: 120/68   There is no height or weight on file to calculate BMI. Constitutional Well developed. Well nourished.  Vascular  Dorsalis pedis pulses palpable bilaterally. Posterior tibial pulses palpable bilaterally. Capillary refill normal to all digits.  No cyanosis or clubbing noted. Pedal hair growth normal.  Neurologic Normal speech. Oriented to person, place, and time. Epicritic sensation to light touch grossly present bilaterally.  Dermatologic Nails well groomed and normal in appearance. No open wounds. No skin lesions.  Orthopedic: Pain on palpation to the right Achilles tendon insertion.  Patient also has Haglund's deformity clinically appreciated.  Patient with positive Silfverskiold test with gastrocnemius equinus.  Pain along the course of the peroneal tendon pain with resisted dorsiflexion eversion of the foot.  No pain with plantarflexion inversion of the foot.   Primarily peroneus brevis tendon involvement.   Radiographs: IMPRESSION: 1. 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 There is also attenuation of the more distal tendon. 2. 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 Additional small chronic partial-thickness midsubstance tear within the greatest area of tendon thickening, within the avascular zone of the Achilles tendon. No tendon retraction. 3. Mild posterior tibial, flexor digitorum longus, and flexor hallucis longus tenosynovitis. 4. 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 Assessment:   1. Tendonitis, Achilles, right   2. Peroneal tendinitis, right    Plan:  Patient was evaluated and treated and all questions answered.  Right Achilles tendinitis insertional pain with underlying Haglund's deformity and gastrocnemius equinus -Questions or concerns were discussed with the patient in extensive detail.  Given the nature of the presentation I believe patient will benefit from surgical options at this time.  She has failed all conservative care at this time.  She would like to proceed with surgery.  I discussed my preoperative intraoperative postop plan in extensive detail including nonweightbearing component.  She states understanding and would like to proceed with surgery -Informed surgical risk consent was reviewed and read aloud to the patient.  I reviewed the films.  I have discussed my findings with the patient in great detail.  I have discussed all risks including but not limited to infection, stiffness, scarring, limp, disability, deformity, damage to blood vessels and nerves, numbness, poor healing, need for braces, arthritis, chronic pain, amputation, death.  All benefits and realistic expectations  discussed in great detail.  I have made no promises as to the outcome.  I have provided realistic expectations.  I have offered the patient a 2nd opinion, which they have declined and assured me they preferred to proceed despite the risks   Right peroneal tendinitis -All questions and concerns were discussed with the patient.  Given the MRI findings of tearing along the peroneus brevis tendon I believe patient will benefit from primary repair of the peroneus brevis.  I discussed my preoperative intra postop plan with the patient in extensive detail.  She would like to proceed with surgery -Informed surgical risk consent was reviewed and read aloud to the patient.  I reviewed the films.  I have discussed my findings with the patient in great detail.  I have discussed all risks including but not limited to infection, stiffness, scarring, limp, disability, deformity, damage to blood vessels and nerves, numbness, poor healing, need for braces, arthritis, chronic pain, amputation, death.  All benefits and realistic expectations discussed in great detail.  I have made no promises as to the outcome.  I have provided realistic expectations.  I have  offered the patient a 2nd opinion, which they have declined and assured me they preferred to proceed despite the risks   No follow-ups on file.

## 2022-07-13 ENCOUNTER — Telehealth: Payer: Self-pay | Admitting: Podiatry

## 2022-07-13 NOTE — Telephone Encounter (Signed)
DOS: 08/13/2022   [employer] Effective 06/25/2022  Repair Post Tibial Tendon Rt (73567) Gastrocnemius Recess Rt (01410) Repair Achilles Tendon Rt (30131)  Deductible: $0 Out-of-Pocket: $1,800 with $0 met CoInsurance: 0%  Prior authorization is not required per [employer], [employer]film/video.  Call Reference #: [id number]

## 2022-08-13 ENCOUNTER — Other Ambulatory Visit: Payer: Self-pay | Admitting: Podiatry

## 2022-08-13 DIAGNOSIS — I10 Essential (primary) hypertension: Secondary | ICD-10-CM | POA: Diagnosis not present

## 2022-08-13 DIAGNOSIS — M7661 Achilles tendinitis, right leg: Secondary | ICD-10-CM | POA: Diagnosis not present

## 2022-08-13 DIAGNOSIS — M216X1 Other acquired deformities of right foot: Secondary | ICD-10-CM | POA: Diagnosis not present

## 2022-08-13 MED ORDER — IBUPROFEN 800 MG PO TABS: 800.0000 mg | ORAL_TABLET | Freq: Four times a day (QID) | ORAL | 1 refills | Status: AC | PRN

## 2022-08-13 MED ORDER — OXYCODONE-ACETAMINOPHEN 5-325 MG PO TABS: 1.0000 | ORAL_TABLET | ORAL | 0 refills | Status: AC | PRN

## 2022-08-15 ENCOUNTER — Encounter: Payer: Self-pay | Admitting: Podiatry

## 2022-08-15 ENCOUNTER — Telehealth: Payer: Self-pay

## 2022-08-15 NOTE — Telephone Encounter (Signed)
Encounter created in error

## 2022-08-21 ENCOUNTER — Ambulatory Visit (INDEPENDENT_AMBULATORY_CARE_PROVIDER_SITE_OTHER): Payer: 59 | Admitting: Podiatry

## 2022-08-21 DIAGNOSIS — M7671 Peroneal tendinitis, right leg: Secondary | ICD-10-CM

## 2022-08-21 DIAGNOSIS — Z9889 Other specified postprocedural states: Secondary | ICD-10-CM

## 2022-08-21 DIAGNOSIS — M25571 Pain in right ankle and joints of right foot: Secondary | ICD-10-CM

## 2022-08-21 DIAGNOSIS — M7661 Achilles tendinitis, right leg: Secondary | ICD-10-CM

## 2022-08-21 NOTE — Progress Notes (Signed)
Subjective:  Patient ID: Kelli Harmon, female    DOB: 1970/12/24,  MRN: [id number]  Chief Complaint  Patient presents with   Routine Post Op    POV#1 DOS 02.19.24 REPAIR POST TIBIAL TENDON RT, REPAIR ACHILLES TENDON RT, GASTROCNEMIUS RECESS RT    DOS: 08/13/2022 Procedure: Right peroneal tendon repair with Achilles tendon repair with gastroc recession and Haglund's resection  52 y.o. female returns for post-op check.  Patient states she is doing okay pain is controlled she is nonweightbearing to the right lower extremity with Cam boot.  Bandages clean dry and intact  Review of Systems: Negative except as noted in the HPI. Denies N/V/F/Ch.  Past Medical History:  Diagnosis Date   Anxiety    Asthma, exercise induced    Environmental allergies    GERD (gastroesophageal reflux disease)    Headache(784.0)    MIGRAINES   Hematuria    History of hypothyroidism    early age 60's -- no issues since   Left nephrolithiasis    Pneumonia    03/16/2014   Right ureteral stone    Shortness of breath    exercise only    Urgency of urination    Wears contact lenses     Current Outpatient Medications:    amLODipine (NORVASC) 5 MG tablet, Take 5 mg by mouth daily., Disp: , Rfl:    amoxicillin (AMOXIL) 500 MG capsule, Take by mouth., Disp: , Rfl:    amoxicillin (AMOXIL) 875 MG tablet, Take 875 mg by mouth 2 (two) times daily., Disp: , Rfl:    chlorhexidine (PERIDEX) 0.12 % solution, SMARTSIG:0.5 Ounce(s) By Mouth Morning-Night, Disp: , Rfl:    hydrochlorothiazide (HYDRODIURIL) 25 MG tablet, , Disp: , Rfl: 0   ibuprofen (ADVIL) 800 MG tablet, Take 1 tablet (800 mg total) by mouth every 6 (six) hours as needed., Disp: 60 tablet, Rfl: 1   ibuprofen (ADVIL) 800 MG tablet, Take 1 tablet (800 mg total) by mouth every 6 (six) hours as needed., Disp: 60 tablet, Rfl: 1   isometheptene-acetaminophen-dichloralphenazone (MIDRIN) 65-100-325 MG capsule, take 1 capsule by mouth every 4 hours if needed,  Disp: , Rfl: 0   levothyroxine (SYNTHROID) 75 MCG tablet, Take 75 mcg by mouth daily., Disp: , Rfl:    MELATONIN PO, Take 1 tablet by mouth at bedtime as needed (sleep). , Disp: , Rfl:    Multiple Vitamins-Minerals (MULTIVITAMIN PO), Take 1 tablet by mouth daily. , Disp: , Rfl:    naproxen sodium (ALEVE) 220 MG tablet, Take 220 mg by mouth daily as needed (headache)., Disp: , Rfl:    oxyCODONE-acetaminophen (PERCOCET) 5-325 MG tablet, Take 1-2 tablets by mouth every 4 (four) hours as needed for severe pain., Disp: 30 tablet, Rfl: 0   oxyCODONE-acetaminophen (PERCOCET) 5-325 MG tablet, Take 1 tablet by mouth every 4 (four) hours as needed for severe pain., Disp: 30 tablet, Rfl: 0   sertraline (ZOLOFT) 50 MG tablet, , Disp: , Rfl: 0   SYNTHROID 25 MCG tablet, Take 25 mcg by mouth every morning., Disp: , Rfl:    SYNTHROID 50 MCG tablet, Take 50 mcg by mouth every morning., Disp: , Rfl:   Social History   Tobacco Use  Smoking Status Never  Smokeless Tobacco Never    Allergies  Allergen Reactions   Codeine Itching and Anxiety   Objective:  There were no vitals filed for this visit. There is no height or weight on file to calculate BMI. Constitutional Well developed. Well nourished.  Vascular  Foot warm and well perfused. Capillary refill normal to all digits.   Neurologic Normal speech. Oriented to person, place, and time. Epicritic sensation to light touch grossly present bilaterally.  Dermatologic Skin healing well without signs of infection. Skin edges well coapted without signs of infection.  Orthopedic: Tenderness to palpation noted about the surgical site.   Radiographs: None Assessment:   1. Right ankle pain, unspecified chronicity   2. Tendonitis, Achilles, right   3. Peroneal tendinitis, right   4. Status post foot surgery    Plan:  Patient was evaluated and treated and all questions answered.  S/p foot surgery right -Progressing as expected post-operatively. -XR:  See above -WB Status: Nonweightbearing to the right lower extremity in a cam boot -Sutures: Intact.  No clinical signs of Deis is noted no complication noted -Medications: None -Foot redressed.  No follow-ups on file.

## 2022-09-04 ENCOUNTER — Ambulatory Visit (INDEPENDENT_AMBULATORY_CARE_PROVIDER_SITE_OTHER): Payer: 59

## 2022-09-04 VITALS — BP 156/98 | HR 98

## 2022-09-04 DIAGNOSIS — M7661 Achilles tendinitis, right leg: Secondary | ICD-10-CM

## 2022-09-04 MED ORDER — IBUPROFEN 800 MG PO TABS: 800.0000 mg | ORAL_TABLET | Freq: Three times a day (TID) | ORAL | 0 refills | Status: AC | PRN

## 2022-09-04 NOTE — Progress Notes (Signed)
Patient presents today for post op visit # 2, patient of Dr. Posey Pronto.    POV#2 DOS 02.19.24 REPAIR POST TIBIAL TENDON RT, REPAIR ACHILLES TENDON RT, GASTROCNEMIUS RECESS RT    Staple removed today without complication. Patient tolerated staple removal well. Incisions look good and no signs of infections. Dr. Posey Pronto did take a look at the patients surgical site and recommended patient slowly began weight-bearing in the boot over the next few days and transition into a supportive shoe before next post-op visit. Patient will do PT exercises as well. Patient verbalized understanding all instructions given by Dr. Posey Pronto.   Reviewed icing and elevation. Patient will follow up with Dr. Posey Pronto  for POV# 3 with x-ray.

## 2022-10-11 ENCOUNTER — Ambulatory Visit (INDEPENDENT_AMBULATORY_CARE_PROVIDER_SITE_OTHER): Payer: 59 | Admitting: Podiatry

## 2022-10-11 DIAGNOSIS — M7671 Peroneal tendinitis, right leg: Secondary | ICD-10-CM

## 2022-10-11 DIAGNOSIS — Z9889 Other specified postprocedural states: Secondary | ICD-10-CM

## 2022-10-11 DIAGNOSIS — M7661 Achilles tendinitis, right leg: Secondary | ICD-10-CM

## 2022-10-11 NOTE — Progress Notes (Signed)
Subjective:  Patient ID: Kelli Harmon, female    DOB: 12-Apr-1971,  MRN: [id number]  Chief Complaint  Patient presents with   Routine Post Op    POV#3 DOS 02.19.24 REPAIR POST TIBIAL TENDON RT, REPAIR ACHILLES TENDON RT, GASTROCNEMIUS RECESS RT    DOS: 08/13/2022 Procedure: Right peroneal tendon repair with Achilles tendon repair with gastroc recession and Haglund's resection  52 y.o. female returns for post-op check.  Patient states she is doing okay pain is controlled she is nonweightbearing to the right lower extremity with Cam boot.  Bandages clean dry and intact  Review of Systems: Negative except as noted in the HPI. Denies N/V/F/Ch.  Past Medical History:  Diagnosis Date   Anxiety    Asthma, exercise induced    Environmental allergies    GERD (gastroesophageal reflux disease)    Headache(784.0)    MIGRAINES   Hematuria    History of hypothyroidism    early age 42's -- no issues since   Left nephrolithiasis    Pneumonia    03/16/2014   Right ureteral stone    Shortness of breath    exercise only    Urgency of urination    Wears contact lenses     Current Outpatient Medications:    amLODipine (NORVASC) 5 MG tablet, Take 5 mg by mouth daily., Disp: , Rfl:    amoxicillin (AMOXIL) 500 MG capsule, Take by mouth., Disp: , Rfl:    amoxicillin (AMOXIL) 875 MG tablet, Take 875 mg by mouth 2 (two) times daily., Disp: , Rfl:    chlorhexidine (PERIDEX) 0.12 % solution, SMARTSIG:0.5 Ounce(s) By Mouth Morning-Night, Disp: , Rfl:    hydrochlorothiazide (HYDRODIURIL) 25 MG tablet, , Disp: , Rfl: 0   ibuprofen (ADVIL) 800 MG tablet, Take 1 tablet (800 mg total) by mouth every 6 (six) hours as needed., Disp: 60 tablet, Rfl: 1   ibuprofen (ADVIL) 800 MG tablet, Take 1 tablet (800 mg total) by mouth every 6 (six) hours as needed., Disp: 60 tablet, Rfl: 1   ibuprofen (ADVIL) 800 MG tablet, Take 1 tablet (800 mg total) by mouth every 8 (eight) hours as needed., Disp: 30 tablet, Rfl: 0    isometheptene-acetaminophen-dichloralphenazone (MIDRIN) 65-100-325 MG capsule, take 1 capsule by mouth every 4 hours if needed, Disp: , Rfl: 0   levothyroxine (SYNTHROID) 75 MCG tablet, Take 75 mcg by mouth daily., Disp: , Rfl:    MELATONIN PO, Take 1 tablet by mouth at bedtime as needed (sleep). , Disp: , Rfl:    Multiple Vitamins-Minerals (MULTIVITAMIN PO), Take 1 tablet by mouth daily. , Disp: , Rfl:    naproxen sodium (ALEVE) 220 MG tablet, Take 220 mg by mouth daily as needed (headache)., Disp: , Rfl:    oxyCODONE-acetaminophen (PERCOCET) 5-325 MG tablet, Take 1-2 tablets by mouth every 4 (four) hours as needed for severe pain., Disp: 30 tablet, Rfl: 0   oxyCODONE-acetaminophen (PERCOCET) 5-325 MG tablet, Take 1 tablet by mouth every 4 (four) hours as needed for severe pain., Disp: 30 tablet, Rfl: 0   sertraline (ZOLOFT) 50 MG tablet, , Disp: , Rfl: 0   SYNTHROID 25 MCG tablet, Take 25 mcg by mouth every morning., Disp: , Rfl:    SYNTHROID 50 MCG tablet, Take 50 mcg by mouth every morning., Disp: , Rfl:   Social History   Tobacco Use  Smoking Status Never  Smokeless Tobacco Never    Allergies  Allergen Reactions   Codeine Itching and Anxiety   Objective:  There were no vitals filed for this visit. There is no height or weight on file to calculate BMI. Constitutional Well developed. Well nourished.  Vascular Foot warm and well perfused. Capillary refill normal to all digits.   Neurologic Normal speech. Oriented to person, place, and time. Epicritic sensation to light touch grossly present bilaterally.  Dermatologic Skin completely epithelialized no further signs of Deis is noted no complication noted.  Orthopedic: Mild tenderness to palpation noted about the surgical site.   Radiographs: None Assessment:   No diagnosis found.  Plan:  Patient was evaluated and treated and all questions answered.  S/p foot surgery right -Clinically healing well. She will begin physical  therapy with her niece.  She has already started ambulating with regular shoes.  Denies any other acute complaints.  I will see her back as a final check in 6 weeks.  No follow-ups on file.

## 2022-10-12 ENCOUNTER — Encounter: Payer: 59 | Admitting: Podiatry

## 2022-11-23 ENCOUNTER — Ambulatory Visit: Payer: 59 | Admitting: Podiatry

## 2022-11-30 ENCOUNTER — Ambulatory Visit (INDEPENDENT_AMBULATORY_CARE_PROVIDER_SITE_OTHER): Payer: 59 | Admitting: Podiatry

## 2022-11-30 DIAGNOSIS — M7661 Achilles tendinitis, right leg: Secondary | ICD-10-CM

## 2022-11-30 NOTE — Progress Notes (Signed)
Subjective:  Patient ID: Kelli Harmon, female    DOB: [birth year],  MRN: [id number]  Chief Complaint  Patient presents with   Routine Post Op    POV#4 DOS 02.19.24 REPAIR POST TIBIAL TENDON RT, REPAIR ACHILLES TENDON RT, GASTROCNEMIUS RECESS RT,  no N/V/F/C/SOB, patient states that she is doing better, still is sore     DOS: 08/13/2022 Procedure: Right peroneal tendon repair with Achilles tendon repair with gastroc recession and Haglund's resection  52 y.o. female returns for post-op check.  Patient states she is doing okay pain is controlled she is nonweightbearing to the right lower extremity with Cam boot.  Bandages clean dry and intact  Review of Systems: Negative except as noted in the HPI. Denies N/V/F/Ch.  Past Medical History:  Diagnosis Date   Anxiety    Asthma, exercise induced    Environmental allergies    GERD (gastroesophageal reflux disease)    Headache(784.0)    MIGRAINES   Hematuria    History of hypothyroidism    early age 59's -- no issues since   Left nephrolithiasis    Pneumonia    03/16/2014   Right ureteral stone    Shortness of breath    exercise only    Urgency of urination    Wears contact lenses     Current Outpatient Medications:    amLODipine (NORVASC) 5 MG tablet, Take 5 mg by mouth daily., Disp: , Rfl:    amoxicillin (AMOXIL) 500 MG capsule, Take by mouth., Disp: , Rfl:    amoxicillin (AMOXIL) 875 MG tablet, Take 875 mg by mouth 2 (two) times daily., Disp: , Rfl:    chlorhexidine (PERIDEX) 0.12 % solution, SMARTSIG:0.5 Ounce(s) By Mouth Morning-Night, Disp: , Rfl:    hydrochlorothiazide (HYDRODIURIL) 25 MG tablet, , Disp: , Rfl: 0   ibuprofen (ADVIL) 800 MG tablet, Take 1 tablet (800 mg total) by mouth every 6 (six) hours as needed., Disp: 60 tablet, Rfl: 1   ibuprofen (ADVIL) 800 MG tablet, Take 1 tablet (800 mg total) by mouth every 6 (six) hours as needed., Disp: 60 tablet, Rfl: 1   ibuprofen (ADVIL) 800 MG tablet, Take 1 tablet (800 mg  total) by mouth every 8 (eight) hours as needed., Disp: 30 tablet, Rfl: 0   isometheptene-acetaminophen-dichloralphenazone (MIDRIN) 65-100-325 MG capsule, take 1 capsule by mouth every 4 hours if needed, Disp: , Rfl: 0   levothyroxine (SYNTHROID) 75 MCG tablet, Take 75 mcg by mouth daily., Disp: , Rfl:    MELATONIN PO, Take 1 tablet by mouth at bedtime as needed (sleep). , Disp: , Rfl:    Multiple Vitamins-Minerals (MULTIVITAMIN PO), Take 1 tablet by mouth daily. , Disp: , Rfl:    naproxen sodium (ALEVE) 220 MG tablet, Take 220 mg by mouth daily as needed (headache)., Disp: , Rfl:    oxyCODONE-acetaminophen (PERCOCET) 5-325 MG tablet, Take 1-2 tablets by mouth every 4 (four) hours as needed for severe pain., Disp: 30 tablet, Rfl: 0   oxyCODONE-acetaminophen (PERCOCET) 5-325 MG tablet, Take 1 tablet by mouth every 4 (four) hours as needed for severe pain., Disp: 30 tablet, Rfl: 0   sertraline (ZOLOFT) 50 MG tablet, , Disp: , Rfl: 0   SYNTHROID 25 MCG tablet, Take 25 mcg by mouth every morning., Disp: , Rfl:    SYNTHROID 50 MCG tablet, Take 50 mcg by mouth every morning., Disp: , Rfl:   Social History   Tobacco Use  Smoking Status Never  Smokeless Tobacco Never  Allergies  Allergen Reactions   Codeine Itching and Anxiety   Objective:  There were no vitals filed for this visit. There is no height or weight on file to calculate BMI. Constitutional Well developed. Well nourished.  Vascular Foot warm and well perfused. Capillary refill normal to all digits.   Neurologic Normal speech. Oriented to person, place, and time. Epicritic sensation to light touch grossly present bilaterally.  Dermatologic Skin completely epithelialized no further signs of Deis is noted no complication noted.  Good range of motion noted at the ankle joint.  Minimal if any pain at the insertion  Orthopedic: Mild tenderness to palpation noted about the surgical site.   Radiographs: None Assessment:   1.  Tendonitis, Achilles, right     Plan:  Patient was evaluated and treated and all questions answered.  S/p foot surgery right -Clinically doing much better.  At this time patient will return to regular activities without any restriction if any foot and ankle issues on future she will come back and see me.  She statres understanding  No follow-ups on file.

## 2023-02-27 DIAGNOSIS — J029 Acute pharyngitis, unspecified: Secondary | ICD-10-CM | POA: Diagnosis not present

## 2023-04-19 DIAGNOSIS — R0602 Shortness of breath: Secondary | ICD-10-CM | POA: Diagnosis not present
